# Patient Record
Sex: Male | Born: 1985 | Race: White | Hispanic: Yes | Marital: Married | Smoking: Current some day smoker
Health system: Southern US, Community
[De-identification: ages and names within clinical notes are randomized; demographics above are authoritative.]

## PROBLEM LIST (undated history)

## (undated) DIAGNOSIS — K250 Acute gastric ulcer with hemorrhage: Secondary | ICD-10-CM

## (undated) HISTORY — PX: COLONOSCOPY: SHX174

## (undated) HISTORY — PX: UPPER GI ENDOSCOPY: SHX6162

---

## 2008-12-13 ENCOUNTER — Emergency Department (HOSPITAL_COMMUNITY): Admission: EM | Admit: 2008-12-13 | Discharge: 2008-12-13 | Payer: Self-pay | Admitting: Emergency Medicine

## 2008-12-18 ENCOUNTER — Ambulatory Visit (HOSPITAL_COMMUNITY): Admission: RE | Admit: 2008-12-18 | Discharge: 2008-12-18 | Payer: Self-pay | Admitting: Pain Medicine

## 2010-12-08 ENCOUNTER — Emergency Department (HOSPITAL_COMMUNITY)
Admission: EM | Admit: 2010-12-08 | Discharge: 2010-12-08 | Disposition: A | Discharge status: Home / Self Care | Payer: BC Managed Care – PPO | Attending: Emergency Medicine | Admitting: Emergency Medicine

## 2010-12-08 ENCOUNTER — Emergency Department (HOSPITAL_COMMUNITY): Payer: BC Managed Care – PPO

## 2010-12-08 DIAGNOSIS — N509 Disorder of male genital organs, unspecified: Secondary | ICD-10-CM | POA: Insufficient documentation

## 2010-12-08 LAB — URINALYSIS, ROUTINE W REFLEX MICROSCOPIC
Glucose, UA: NEGATIVE mg/dL
Leukocytes, UA: NEGATIVE
Protein, ur: NEGATIVE mg/dL
pH: 5.5 (ref 5.0–8.0)

## 2011-05-18 DIAGNOSIS — K250 Acute gastric ulcer with hemorrhage: Secondary | ICD-10-CM

## 2011-05-18 HISTORY — DX: Acute gastric ulcer with hemorrhage: K25.0

## 2013-01-25 IMAGING — US US ART/VEN ABD/PELV/SCROTUM DOPPLER LTD
1 series · 14 of 25 positions shown · non-contrast
Comparison: None

CLINICAL DATA: Left scrotal pain

SCROTAL ULTRASOUND
DOPPLER ULTRASOUND OF THE TESTICLES
TECHNIQUE: Complete ultrasound examination of the testicles,
epididymis, and other scrotal structures was performed.  Color and
spectral Doppler ultrasound were also utilized to evaluate blood
flow to the testicles.

[Series 1: us art/ven abd/pelv/scrotum doppler ltd · 0.08mm/px · 14 of 33 slices shown]
[im 1/33]
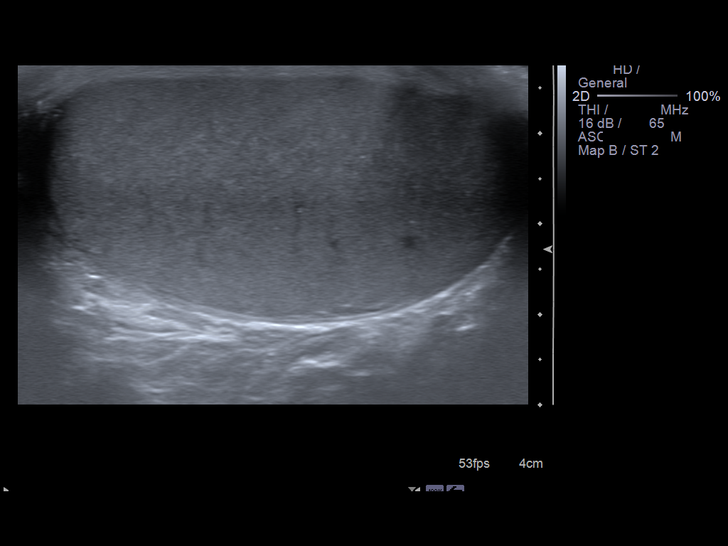
[im 3/33]
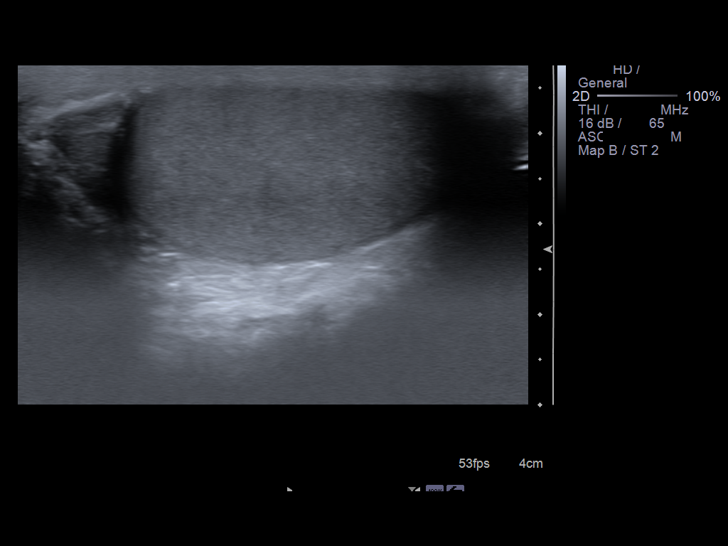
[im 6/33]
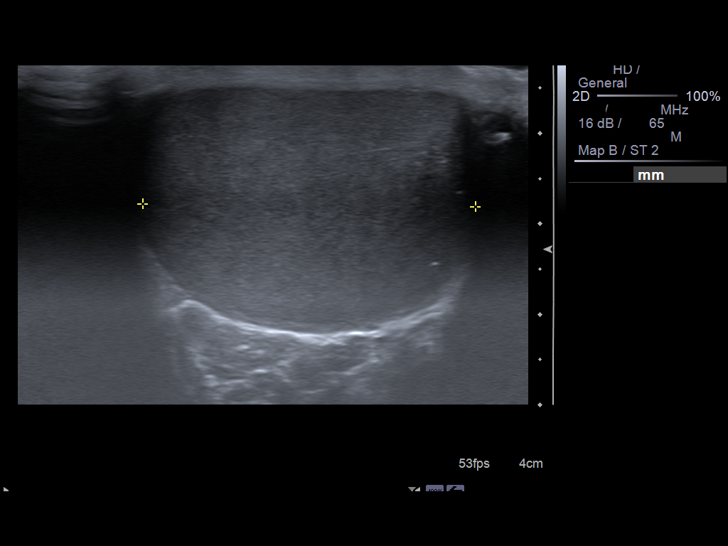
[im 9/33]
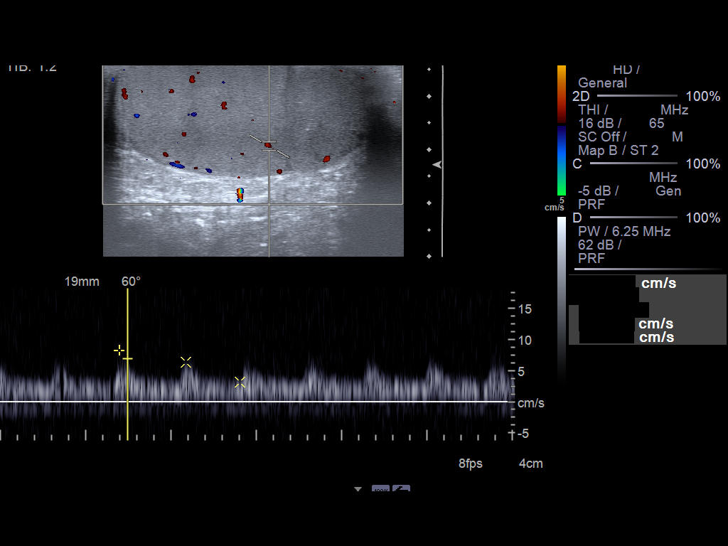
[im 11/33]
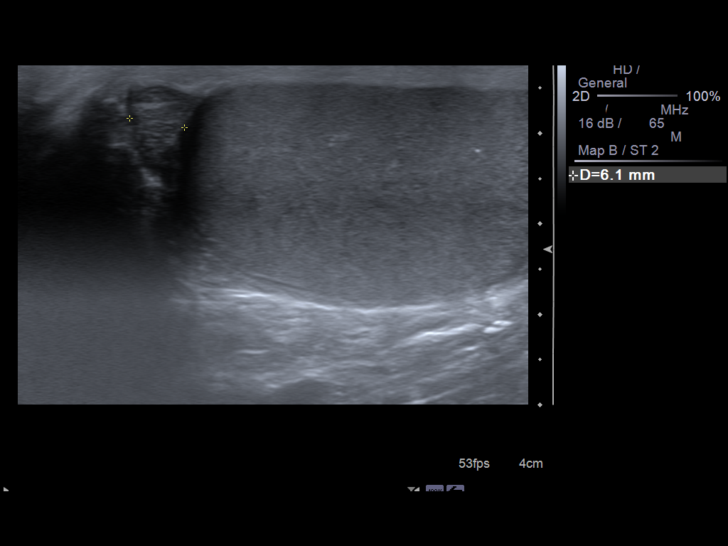
[im 13/33]
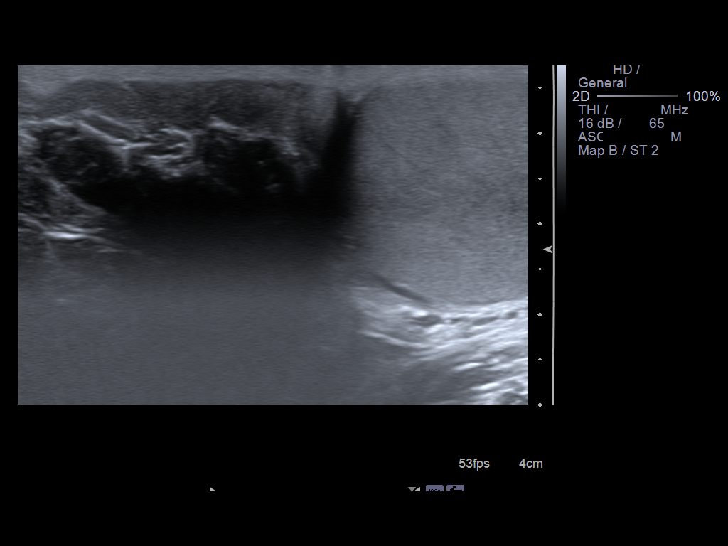
[im 15/33]
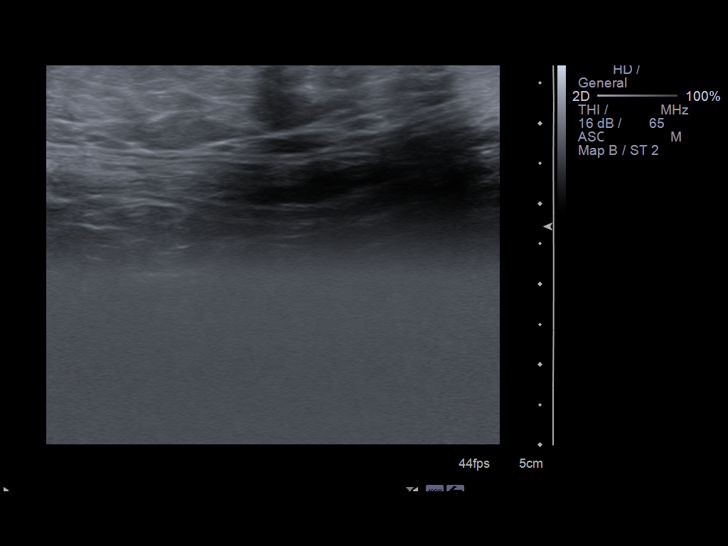
[im 18/33]
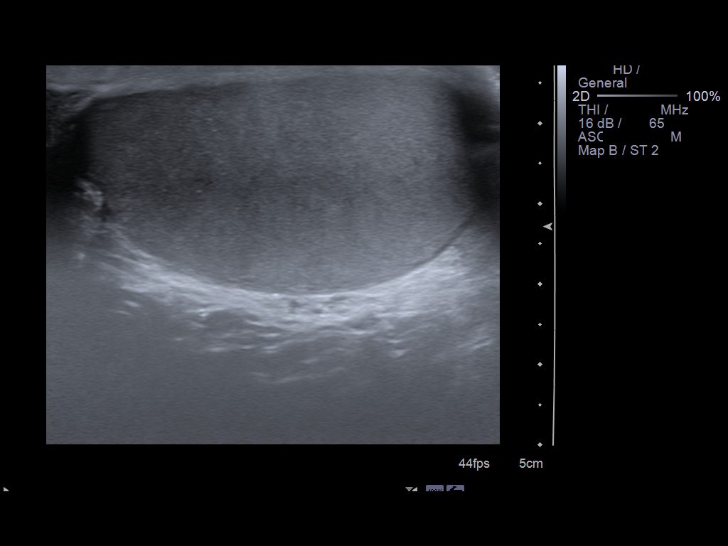
[im 21/33]
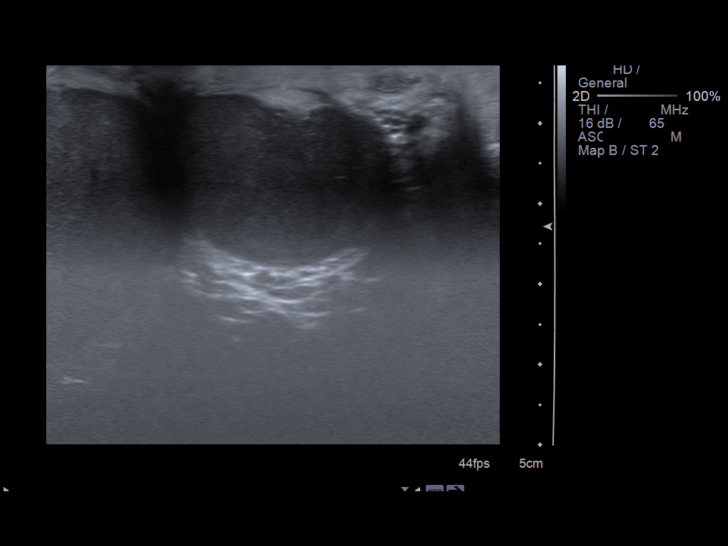
[im 22/33]
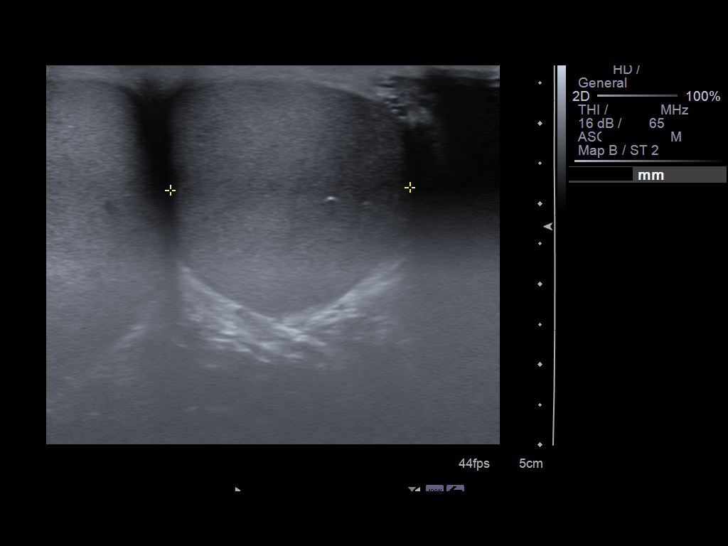
[im 25/33]
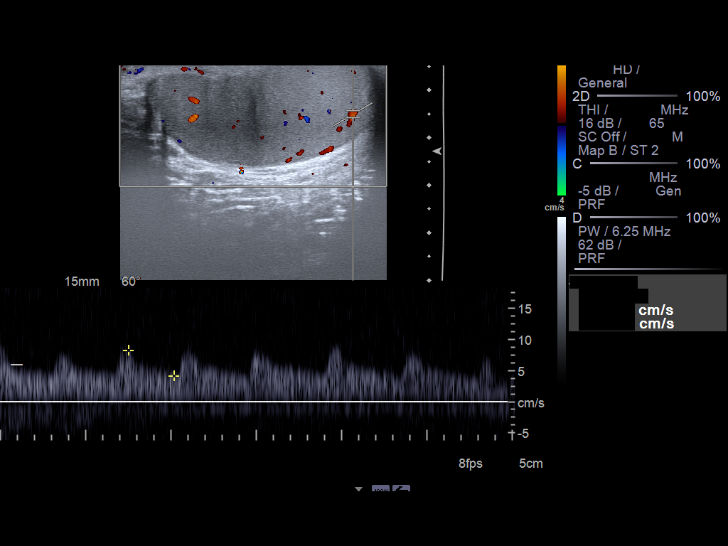
[im 27/33]
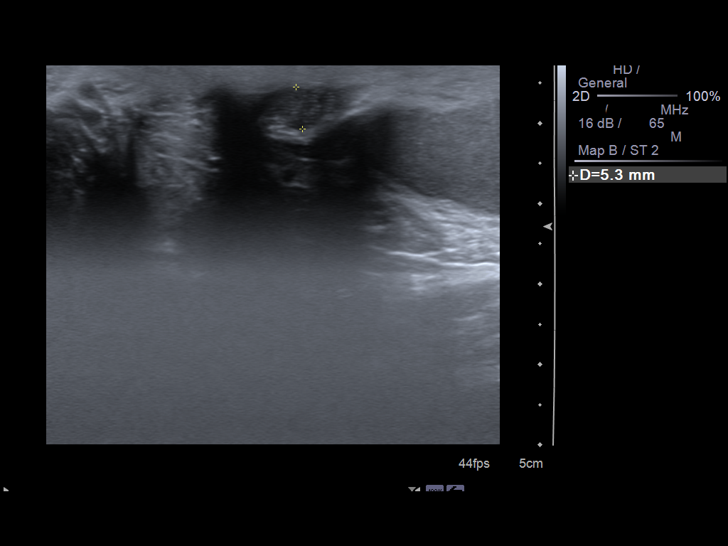
[im 30/33]
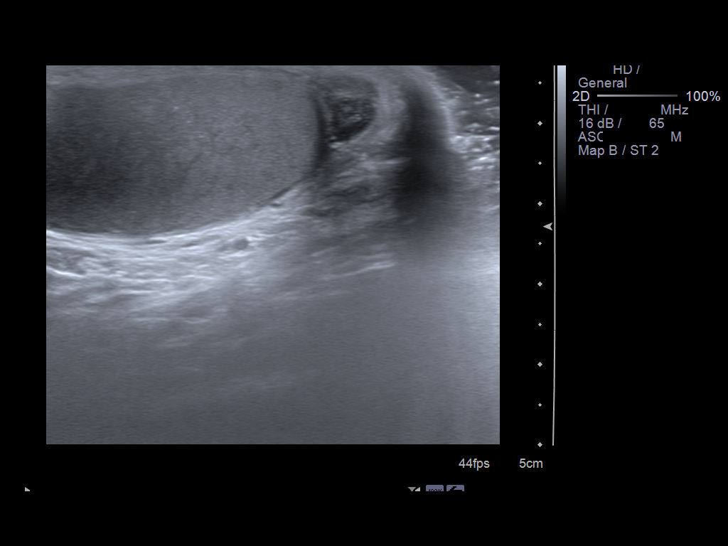
[im 33/33]
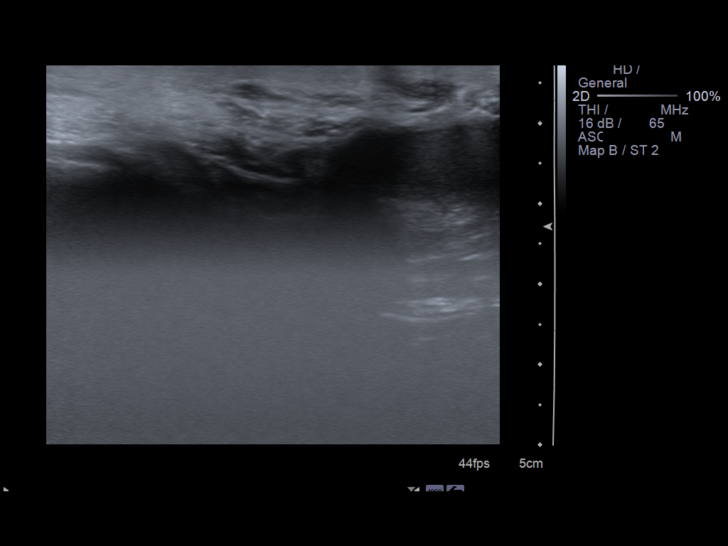

[14 of 25 positions shown; findings below may reference images not displayed]

FINDINGS: Right testis:  27 x 37 x 53 mm with scattered microcalcifications.
Normal color Doppler signal.  Arterial and venous waveforms
returned.

Left testis:  27 x 30 x 50 mm with a few scattered
microcalcifications.  Normal color Doppler signal.  Arterial and
venous waveforms returned.

Right epididymis:  Normal in size and appearance.

Left epididymis:  Normal in size and appearance.

Hydocele:  None

Varicocele:  None seen

Pulsed Doppler interrogation of both testes demonstrates low
resistance flow bilaterally.
IMPRESSION: 1.  Negative for focal mass or evidence of testicular torsion.
2.  Mild bilateral testicular microlithiasis, of questionable
clinical significance.

## 2013-05-05 ENCOUNTER — Emergency Department (HOSPITAL_COMMUNITY)
Admission: EM | Admit: 2013-05-05 | Discharge: 2013-05-06 | Disposition: A | Discharge status: Home / Self Care | Payer: BC Managed Care – PPO | Attending: Emergency Medicine | Admitting: Emergency Medicine

## 2013-05-05 ENCOUNTER — Encounter (HOSPITAL_COMMUNITY): Payer: Self-pay | Admitting: Emergency Medicine

## 2013-05-05 DIAGNOSIS — K921 Melena: Secondary | ICD-10-CM | POA: Insufficient documentation

## 2013-05-05 DIAGNOSIS — Z9889 Other specified postprocedural states: Secondary | ICD-10-CM | POA: Insufficient documentation

## 2013-05-05 DIAGNOSIS — R5381 Other malaise: Secondary | ICD-10-CM | POA: Insufficient documentation

## 2013-05-05 HISTORY — DX: Acute gastric ulcer with hemorrhage: K25.0

## 2013-05-05 LAB — CBC
HCT: 47.1 % (ref 39.0–52.0)
Hemoglobin: 16.7 g/dL (ref 13.0–17.0)
RDW: 13.5 % (ref 11.5–15.5)
WBC: 8.2 10*3/uL (ref 4.0–10.5)

## 2013-05-05 NOTE — ED Notes (Signed)
Pt arrived to ED with a complaint of dark stools.  Pt has a previous diagnosis of Dieulafoy's legions and the symptoms are similar to what he has experienced previously. Pt's child became sick earlier in the week with diarrhea and vomiting which then the Pt began expreriencing.  Pt states that it was yesterday that he began having dark black stools.

## 2013-05-05 NOTE — ED Notes (Signed)
Patient is alert and oriented x3.  He is complaining of mid abdominal pain that he currently rates 2 of 10.  He states that he started having dark tary stools that started yesterday.  He has been Dx with a Dieulafoy lesion in the past.  Currently patient has no hematemesis.

## 2013-05-06 MED ORDER — OMEPRAZOLE 20 MG PO CPDR: 20.0000 mg | DELAYED_RELEASE_CAPSULE | Freq: Every day | ORAL | Status: DC

## 2013-05-06 NOTE — ED Provider Notes (Signed)
Medical screening examination/treatment/procedure(s) were performed by non-physician practitioner and as supervising physician I was immediately available for consultation/collaboration.    Olivia Mackie, MD 05/06/13 2131

## 2013-05-06 NOTE — ED Notes (Signed)
Patient is alert and oriented x3.  He was given DC instructions and follow up visit instructions.  Patient gave verbal understanding.  He was DC ambulatory under his own power to home.  V/S stable.  He was not showing any signs of distress on DC 

## 2013-05-06 NOTE — ED Provider Notes (Signed)
CSN: [id number]     Arrival date & time 05/05/13  2018 History   First MD Initiated Contact with Patient 05/05/13 2057     Chief Complaint  Patient presents with  . Rectal Bleeding   (Consider location/radiation/quality/duration/timing/severity/associated sxs/prior Treatment) The history is provided by the spouse and the patient. The history is limited by a language barrier. Language interpreter used: interpretation provided by wife who is fluent in both languages.  This is a 27 y/o male with a PMH of Dieulafoy's disorder who presents with cc of dark, tarry stools. Patient was diagnosed approx. 2 years ago in [place]. He was admitted and underwent endoscopic cauterization He has had no problems since that thime. The patient states that 2 days ago he began having black stools similar to his previous episode. Denies ingestion of bismuth or Iron supplements. Patient Has associated weakness starting today but denies abdominal pain, nausea, hematemesis, diarrhea, BRPR. Denies use of NSAIDS, etoh. Denies fevers, chills, myalgias, arthralgias. Denies DOE, SOB, chest tightness or pressure, radiation to left arm, jaw or back, or diaphoresis. Denies dysuria, flank pain, suprapubic pain, frequency, urgency, or hematuria. Denies headaches, light headedness, weakness, visual disturbances.    Past Medical History  Diagnosis Date  . Dieulafoy ulcer 2013   No past surgical history on file. No family history on file. History  Substance Use Topics  . Smoking status: Not on file  . Smokeless tobacco: Not on file  . Alcohol Use: Not on file    Review of Systems  Constitutional: Negative for fever and chills.  Respiratory: Negative for cough and shortness of breath.   Cardiovascular: Negative for chest pain and palpitations.  Gastrointestinal: Positive for blood in stool (melena). Negative for vomiting, abdominal pain, diarrhea and constipation.  Genitourinary: Negative for dysuria, urgency and frequency.   Musculoskeletal: Negative for arthralgias and myalgias.  Skin: Negative for rash.  Neurological: Positive for weakness. Negative for headaches.    Allergies  Review of patient's allergies indicates no known allergies.  Home Medications   Current Outpatient Rx  Name  Route  Sig  Dispense  Refill  . loperamide (IMODIUM) 2 MG capsule   Oral   Take by mouth as needed for diarrhea or loose stools.         Marland Kitchen OVER THE COUNTER MEDICATION   Oral   Take 1 tablet by mouth 2 (two) times daily. Patient unaware of name of medication. Picked up at [place] store, used for infection          BP 111/58  Pulse 91  Temp(Src) 99.5 F (37.5 C) (Oral)  Resp 16  SpO2 95% Physical Exam  Nursing note and vitals reviewed. Constitutional: He appears well-developed and well-nourished. No distress.  HENT:  Head: Normocephalic and atraumatic.  Eyes: Conjunctivae are normal. No scleral icterus.  Neck: Normal range of motion. Neck supple.  Cardiovascular: Normal rate, regular rhythm and normal heart sounds.   Pulmonary/Chest: Effort normal and breath sounds normal. No respiratory distress.  Abdominal: Soft. There is no tenderness.  Musculoskeletal: He exhibits no edema.  Neurological: He is alert.  Skin: Skin is warm and dry. He is not diaphoretic.  Psychiatric: His behavior is normal.    ED Course  Procedures (including critical care time) Labs Review Labs Reviewed  CBC   Imaging Review No results found.  EKG Interpretation   None       MDM   1. Melena    BP 111/58  Pulse 91  Temp(Src) 99.5 F (  37.5 C) (Oral)  Resp 16  SpO2 95% Patient with + occult stool blood.  HGB stable. Do not feel he meets inpatient work up at this time. I have discussed specific reasons to return tot he ED. Patient is to follow closely with GI.  The patient appears reasonably screened and/or stabilized for discharge and I doubt any other medical condition or other [facility] requiring further screening,  evaluation, or treatment in the ED at this time prior to discharge.     Arthor Captain, PA-C 05/06/13 1539

## 2013-12-23 ENCOUNTER — Inpatient Hospital Stay (HOSPITAL_COMMUNITY)
Admission: EM | Admit: 2013-12-23 | Discharge: 2013-12-25 | DRG: 379 | Disposition: A | Discharge status: Home / Self Care | Payer: BC Managed Care – PPO | Attending: Family Medicine | Admitting: Family Medicine

## 2013-12-23 ENCOUNTER — Encounter (HOSPITAL_COMMUNITY): Payer: Self-pay | Admitting: Emergency Medicine

## 2013-12-23 ENCOUNTER — Encounter (HOSPITAL_COMMUNITY)
Admission: EM | Disposition: A | Discharge status: Home / Self Care | Payer: Self-pay | Source: Home / Self Care | Attending: Family Medicine

## 2013-12-23 DIAGNOSIS — Z23 Encounter for immunization: Secondary | ICD-10-CM

## 2013-12-23 DIAGNOSIS — K260 Acute duodenal ulcer with hemorrhage: Principal | ICD-10-CM | POA: Diagnosis present

## 2013-12-23 DIAGNOSIS — K921 Melena: Secondary | ICD-10-CM

## 2013-12-23 DIAGNOSIS — R12 Heartburn: Secondary | ICD-10-CM | POA: Diagnosis present

## 2013-12-23 DIAGNOSIS — K264 Chronic or unspecified duodenal ulcer with hemorrhage: Secondary | ICD-10-CM | POA: Diagnosis present

## 2013-12-23 DIAGNOSIS — A048 Other specified bacterial intestinal infections: Secondary | ICD-10-CM | POA: Diagnosis present

## 2013-12-23 DIAGNOSIS — K922 Gastrointestinal hemorrhage, unspecified: Secondary | ICD-10-CM | POA: Diagnosis present

## 2013-12-23 DIAGNOSIS — F17200 Nicotine dependence, unspecified, uncomplicated: Secondary | ICD-10-CM | POA: Diagnosis present

## 2013-12-23 HISTORY — PX: ESOPHAGOGASTRODUODENOSCOPY: SHX5428

## 2013-12-23 LAB — CBC WITH DIFFERENTIAL/PLATELET
BASOS PCT: 0 % (ref 0–1)
Basophils Absolute: 0 10*3/uL (ref 0.0–0.1)
EOS ABS: 0.2 10*3/uL (ref 0.0–0.7)
EOS PCT: 2 % (ref 0–5)
HCT: 47.7 % (ref 39.0–52.0)
HEMOGLOBIN: 16.8 g/dL (ref 13.0–17.0)
LYMPHS ABS: 2 10*3/uL (ref 0.7–4.0)
Lymphocytes Relative: 26 % (ref 12–46)
MCH: 29.9 pg (ref 26.0–34.0)
MCHC: 35.2 g/dL (ref 30.0–36.0)
MCV: 85 fL (ref 78.0–100.0)
MONOS PCT: 5 % (ref 3–12)
Monocytes Absolute: 0.4 10*3/uL (ref 0.1–1.0)
Neutro Abs: 5.1 10*3/uL (ref 1.7–7.7)
Neutrophils Relative %: 67 % (ref 43–77)
PLATELETS: 226 10*3/uL (ref 150–400)
RBC: 5.61 MIL/uL (ref 4.22–5.81)
RDW: 13 % (ref 11.5–15.5)
WBC: 7.7 10*3/uL (ref 4.0–10.5)

## 2013-12-23 LAB — COMPREHENSIVE METABOLIC PANEL
ALBUMIN: 4.4 g/dL (ref 3.5–5.2)
ALT: 150 U/L — ABNORMAL HIGH (ref 0–53)
ANION GAP: 14 (ref 5–15)
AST: 60 U/L — ABNORMAL HIGH (ref 0–37)
Alkaline Phosphatase: 60 U/L (ref 39–117)
BUN: 23 mg/dL (ref 6–23)
CALCIUM: 9.5 mg/dL (ref 8.4–10.5)
CO2: 21 mEq/L (ref 19–32)
Chloride: 101 mEq/L (ref 96–112)
Creatinine, Ser: 0.77 mg/dL (ref 0.50–1.35)
GFR calc non Af Amer: >90 mL/min (ref >90)
GLUCOSE: 106 mg/dL — AB (ref 70–99)
Potassium: 4.4 mEq/L (ref 3.7–5.3)
Sodium: 136 mEq/L — ABNORMAL LOW (ref 137–147)
TOTAL PROTEIN: 8 g/dL (ref 6.0–8.3)
Total Bilirubin: 0.6 mg/dL (ref 0.3–1.2)

## 2013-12-23 LAB — PROTIME-INR
INR: 0.99 (ref 0.00–1.49)
PROTHROMBIN TIME: 13.1 s (ref 11.6–15.2)

## 2013-12-23 LAB — TYPE AND SCREEN
ABO/RH(D): O POS
ANTIBODY SCREEN: NEGATIVE

## 2013-12-23 LAB — ABO/RH: ABO/RH(D): O POS

## 2013-12-23 LAB — HEMOGLOBIN AND HEMATOCRIT, BLOOD
HEMATOCRIT: 42 % (ref 39.0–52.0)
HEMOGLOBIN: 14.7 g/dL (ref 13.0–17.0)

## 2013-12-23 LAB — APTT: APTT: 31 s (ref 24–37)

## 2013-12-23 LAB — POC OCCULT BLOOD, ED: Fecal Occult Bld: POSITIVE — AB

## 2013-12-23 SURGERY — EGD (ESOPHAGOGASTRODUODENOSCOPY)
Anesthesia: Moderate Sedation

## 2013-12-23 MED ORDER — SODIUM CHLORIDE 0.9 % IV SOLN
INTRAVENOUS | Status: AC
  Administered 2013-12-23: 20:00:00 via INTRAVENOUS

## 2013-12-23 MED ORDER — MIDAZOLAM HCL 10 MG/2ML IJ SOLN
INTRAMUSCULAR | Status: DC | PRN
  Administered 2013-12-23: 1 mg via INTRAVENOUS
  Administered 2013-12-23 (×3): 2 mg via INTRAVENOUS

## 2013-12-23 MED ORDER — MIDAZOLAM HCL 10 MG/2ML IJ SOLN
INTRAMUSCULAR | Status: AC
  Filled 2013-12-23: qty 4

## 2013-12-23 MED ORDER — EPINEPHRINE HCL 0.1 MG/ML IJ SOSY
PREFILLED_SYRINGE | INTRAMUSCULAR | Status: AC
  Filled 2013-12-23: qty 10

## 2013-12-23 MED ORDER — DIPHENHYDRAMINE HCL 50 MG/ML IJ SOLN
INTRAMUSCULAR | Status: AC
  Filled 2013-12-23: qty 1

## 2013-12-23 MED ORDER — DIPHENHYDRAMINE HCL 50 MG/ML IJ SOLN
INTRAMUSCULAR | Status: DC | PRN
  Administered 2013-12-23: 25 mg via INTRAVENOUS

## 2013-12-23 MED ORDER — BUTAMBEN-TETRACAINE-BENZOCAINE 2-2-14 % EX AERO
INHALATION_SPRAY | CUTANEOUS | Status: DC | PRN
  Administered 2013-12-23: 2 via TOPICAL

## 2013-12-23 MED ORDER — ONDANSETRON HCL 4 MG/2ML IJ SOLN: 4.0000 mg | Freq: Four times a day (QID) | INTRAMUSCULAR | Status: DC | PRN

## 2013-12-23 MED ORDER — SODIUM CHLORIDE 0.9 % IV BOLUS (SEPSIS)
1000.0000 mL | Freq: Once | INTRAVENOUS | Status: AC
  Administered 2013-12-23: 1000 mL via INTRAVENOUS

## 2013-12-23 MED ORDER — FENTANYL CITRATE 0.05 MG/ML IJ SOLN
INTRAMUSCULAR | Status: DC | PRN
  Administered 2013-12-23 (×4): 25 ug via INTRAVENOUS

## 2013-12-23 MED ORDER — PANTOPRAZOLE SODIUM 40 MG IV SOLR
80.0000 mg | Freq: Once | INTRAVENOUS | Status: AC
  Administered 2013-12-23: 80 mg via INTRAVENOUS
  Filled 2013-12-23: qty 80

## 2013-12-23 MED ORDER — ONDANSETRON HCL 4 MG PO TABS: 4.0000 mg | ORAL_TABLET | Freq: Four times a day (QID) | ORAL | Status: DC | PRN

## 2013-12-23 MED ORDER — PNEUMOCOCCAL VAC POLYVALENT 25 MCG/0.5ML IJ INJ
0.5000 mL | INJECTION | INTRAMUSCULAR | Status: DC
  Filled 2013-12-23 (×3): qty 0.5

## 2013-12-23 MED ORDER — PANTOPRAZOLE SODIUM 40 MG IV SOLR: 40.0000 mg | Freq: Two times a day (BID) | INTRAVENOUS | Status: DC

## 2013-12-23 MED ORDER — SODIUM CHLORIDE 0.9 % IV SOLN
INTRAVENOUS | Status: DC
  Administered 2013-12-24: 02:00:00 via INTRAVENOUS

## 2013-12-23 MED ORDER — FENTANYL CITRATE 0.05 MG/ML IJ SOLN
INTRAMUSCULAR | Status: AC
  Filled 2013-12-23: qty 4

## 2013-12-23 MED ORDER — SODIUM CHLORIDE 0.9 % IV SOLN
8.0000 mg/h | INTRAVENOUS | Status: DC
  Administered 2013-12-23 – 2013-12-25 (×5): 8 mg/h via INTRAVENOUS
  Filled 2013-12-23 (×10): qty 80

## 2013-12-23 NOTE — Progress Notes (Signed)
Spoke to the patient's wife, Billy CoastDaynez, by phone.  We discussed EGD findings of bleeding ulceration with plans for admission. She thanked me for the call

## 2013-12-23 NOTE — H&P (Signed)
[facility] History and Physical  Chad MassyRicardo Marotto ZOX:[id number] DOB: 01/17/1986 DOA: 12/23/2013  Referring physician: Rhea BeltonPyrtle PCP: No primary provider on file.   Chief Complaint: black stool  HPI: Chad MassyRicardo Kaiser is a 28 y.o. male  Speaks little English, presents with 2 episodes melena today. No N/V. No pain. Previous h/o GIB requiring transfusion. Had EGD by Dr. Rhea BeltonPyrtle, shows large blood clot in stomach and bleeding duodenal ulcer, which was injected then clipped.  Started on clear liquids, protonix gtt and referred to medicine for admission. No NSAIDs. [facility] per wife who provides most history.   Review of Systems:  Systems reviewed. As above, otherwise negative  Past Medical History  Diagnosis Date  . Dieulafoy ulcer 2013   Past Surgical History  Procedure Laterality Date  . Upper gi endoscopy    . Colonoscopy     Social History: drinks occasionally. Smokes cigarettes occasionally. Denies drugs. Works in Education administrator"demolition"  No Known Allergies  History reviewed. No pertinent family history.   Prior to Admission medications   Not on File   Physical Exam: Filed Vitals:   12/23/13 1535 12/23/13 1608 12/23/13 1630 12/23/13 1748  BP:  123/76 136/84 121/72  Pulse: 113 86 95 89  Temp:  98.7 F (37.1 C)  97.7 F (36.5 C)  TempSrc:  Oral  Oral  Resp: 16 18 17 16   SpO2: 100% 94% 99% 97%    Wt Readings from Last 3 Encounters:  No data found for Wt  BP 121/72  Pulse 89  Temp(Src) 97.7 F (36.5 C) (Oral)  Resp 16  SpO2 97%  General Appearance:    Groggy. arousable and cooperative  Head:    Normocephalic, without obvious abnormality, atraumatic  Eyes:    PERRL, pale conjunctiva          Nose:   Nares normal, septum midline, mucosa normal, no drainage   or sinus tenderness  Throat:   Lips, mucosa, and tongue normal; teeth and gums normal  Neck:   Supple, symmetrical, trachea midline, no adenopathy;       thyroid:  No  enlargement/tenderness/nodules; no carotid   bruit or JVD  Back:     Symmetric, no curvature, ROM normal, no CVA tenderness  Lungs:     Clear to auscultation bilaterally, respirations unlabored  Chest wall:    No tenderness or deformity  Heart:    Regular rate and rhythm, S1 and S2 normal, no murmur, rub   or gallop  Abdomen:     Soft, non-tender, bowel sounds active all four quadrants,    no masses, no organomegaly  Genitalia:    deferred  Rectal:   deferred  Extremities:   Extremities normal, atraumatic, no cyanosis or edema  Pulses:   2+ and symmetric all extremities  Skin:   Skin color, texture, turgor normal, no rashes or lesions  Lymph nodes:   Cervical, supraclavicular, and axillary nodes normal  Neurologic:   CNII-XII intact. Normal strength, sensation and reflexes      throughout           Psych: normal affect  Labs on Admission:  Basic Metabolic Panel:  Recent Labs Lab 12/23/13 1143  NA 136*  K 4.4  CL 101  CO2 21  GLUCOSE 106*  BUN 23  CREATININE 0.77  CALCIUM 9.5   Liver Function Tests:  Recent Labs Lab 12/23/13 1143  AST 60*  ALT 150*  ALKPHOS 60  BILITOT 0.6  PROT 8.0  ALBUMIN 4.4  No results found for this basename: LIPASE, AMYLASE,  in the last 168 hours No results found for this basename: AMMONIA,  in the last 168 hours CBC:  Recent Labs Lab 12/23/13 1143  WBC 7.7  NEUTROABS 5.1  HGB 16.8  HCT 47.7  MCV 85.0  PLT 226   Cardiac Enzymes: No results found for this basename: CKTOTAL, CKMB, CKMBINDEX, TROPONINI,  in the last 168 hours  BNP (last 3 results) No results found for this basename: PROBNP,  in the last 8760 hours CBG: No results found for this basename: GLUCAP,  in the last 168 hours  Radiological Exams on Admission: No results found.  Assessment/Plan Active Problems:   Duodenal ulcer with hemorrhage   GI bleed   Admit to floor. Clears. protonix gtt. Serial h/h. F/u h pylori serology   Code Status: full DVT  Prophylaxis: SCD Family Communication: wife at bedside Disposition Plan: home  Time spent: 60 min  Charina Fons L [facility] Pager [phone]

## 2013-12-23 NOTE — ED Provider Notes (Signed)
  Physical Exam  BP 123/76  Pulse 86  Temp(Src) 98.7 F (37.1 C) (Oral)  Resp 18  SpO2 94%  Physical Exam  ED Course  Procedures Dr. Rhea BeltonPyrtle scoped patient. Found active bleeding ulcer. Recommend admission. Patient on protonix drip. Hg stable. Vitals stable. Will admit to floor.       Richardean Canalavid H Johnette Teigen, MD 12/23/13 [id number]

## 2013-12-23 NOTE — ED Provider Notes (Signed)
TIME SEEN: 11:20 AM  CHIEF COMPLAINT: Melena  HPI: Patient is a 28 year old male with a history of prior ulcer who presents to the emergency department with melena that started today. Patient's wife reports approximately one year ago he had a GI bleed in [place] and had an endoscopy and colonoscopy.  She reports that he had a bleeding area in his colon that was surgically repaired.  He states he has had intermittent melena over the past year, last was in December. He states that he is now at 3 episodes of melena today which made him concerned. He denies any bright red blood per time. Denies any abdominal pain, fevers, chest pain or shortness of breath, dizziness. He is not on anticoagulation. Denies a history of heavy alcohol use or NSAID use. He does have a primary care physician or gastroenterologist here in [place].   ROS: See HPI Constitutional: no fever  Eyes: no drainage  ENT: no runny nose   Cardiovascular:  no chest pain  Resp: no SOB  GI: no vomiting GU: no dysuria Integumentary: no rash  Allergy: no hives  Musculoskeletal: no leg swelling  Neurological: no slurred speech ROS otherwise negative  PAST MEDICAL HISTORY/PAST SURGICAL HISTORY:  Past Medical History  Diagnosis Date  . Dieulafoy ulcer 2013    MEDICATIONS:  Prior to Admission medications   Not on File    ALLERGIES:  No Known Allergies  SOCIAL HISTORY:  History  Substance Use Topics  . Smoking status: Never Smoker   . Smokeless tobacco: Not on file  . Alcohol Use: No    FAMILY HISTORY: No family history on file.  EXAM: BP 140/107  Pulse 118  Temp(Src) 98.7 F (37.1 C) (Oral)  Resp 16  SpO2 98% CONSTITUTIONAL: Alert and oriented and responds appropriately to questions. Well-appearing; well-nourished HEAD: Normocephalic EYES: Conjunctivae clear, PERRL ENT: normal nose; no rhinorrhea; moist mucous membranes; pharynx without lesions noted NECK: Supple, no meningismus, no LAD  CARD:  regular  and tachycardic ; S1 and S2 appreciated; no murmurs, no clicks, no rubs, no gallops RESP: Normal chest excursion without splinting or tachypnea; breath sounds clear and equal bilaterally; no wheezes, no rhonchi, no rales,  ABD/GI: Normal bowel sounds; non-distended; soft, non-tender, no rebound, no guarding RECTAL:  Patient has normal rectal tone, patient has melanotic stools, no gross blood  BACK:  The back appears normal and is non-tender to palpation, there is no CVA tenderness EXT: Normal ROM in all joints; non-tender to palpation; no edema; normal capillary refill; no cyanosis    SKIN: Normal color for age and race; warm NEURO: Moves all extremities equally PSYCH: The patient's mood and manner are appropriate. Grooming and personal hygiene are appropriate.  MEDICAL DECISION MAKING: Patient here with melena. He is tachycardic but otherwise hemodynamically stable. He has no abdominal pain on exam. Will check CBC, type and screen. We'll discuss with GI on call. We'll give IV fluids, protonix bolus and drip.  ED PROGRESS:   Pt's Hb is 16 which is stable.  D/w Dr. Rhea Kaiser with GI who will try to take pt to endoscopy now.  NPO since 10pm other than small amount of liquids this AM.  Pt agrees with plan.   2:21 PM  HR has improved with IV fluids. He has been taken to endoscopy. Disposition per GI.  CRITICAL CARE Performed by: Chad Kaiser   Total critical care time: 45 minutes  Critical care time was exclusive of separately billable procedures and treating other  patients.  GI bleed, on protonix drip, gastroenterology consult.   Critical care was necessary to treat or prevent imminent or life-threatening deterioration.  Critical care was time spent personally by me on the following activities: development of treatment plan with patient and/or surrogate as well as nursing, discussions with consultants, evaluation of patient's response to treatment, examination of patient, obtaining history  from patient or surrogate, ordering and performing treatments and interventions, ordering and review of laboratory studies, ordering and review of radiographic studies, pulse oximetry and re-evaluation of patient's condition.     Chad MawKristen N Ward, DO 12/23/13 1422

## 2013-12-23 NOTE — ED Notes (Signed)
Bed: WA09 Expected date:  Expected time:  Means of arrival:  Comments: Pt from endoscopy

## 2013-12-23 NOTE — Op Note (Signed)
[facility] [address] [place], [id number]   ENDOSCOPY PROCEDURE REPORT  PATIENT: Chad Kaiser, Chad Kaiser  MR#: [id number] BIRTHDATE: 19-Sep-1985 , 28  yrs. old GENDER: Male ENDOSCOPIST: Beverley FiedlerJay M Annalyse Langlais, MD REFERRED BY:  Dr. Baxter HireKristen Ward, DO PROCEDURE DATE:  12/23/2013 PROCEDURE:  EGD w/ control of bleeding ASA CLASS:     Class II INDICATIONS:  Melena. MEDICATIONS: These medications were titrated to patient response per physician's verbal order, Fentanyl 100 mcg IV, Versed 8 mg IV, Diphenhydramine (Benadryl) 25 mg IV, and Epinephrine 3 cc TOPICAL ANESTHETIC: Cetacaine Spray  DESCRIPTION OF PROCEDURE: After the risks benefits and alternatives of the procedure were thoroughly explained, informed consent was obtained.  The Pentax Gastroscope [id number] endoscope was introduced through the mouth and advanced to the second portion of the duodenum. Without limitations.  The instrument was slowly withdrawn as the mucosa was fully examined.  ESOPHAGUS: The mucosa of the esophagus appeared normal.   A variable Z-line was observed 40 cm from the incisors.  STOMACH: Fresh blood and clot was found in the stomach.  Copious irrigation and lavage performed, but not all blood could be cleared from the stomach, therefore views of some portions of the gastric cardia, fundus, and body were incomplete  DUODENUM: A single ulcer measuring 4 x 6mm in size with an adherent clot and active bleeding was found in the duodenal bulb. Initially epinephrine injection was performed around the ulcer using epinephrine 1:10,000 (3 cc total).  This resulted in definite slowing of bleeding and blanching of the mucosa.  Complete hemostasis was achieved by placing two [facility] hemoclips on the bleeding site.  No other ulcerations were seen in the second portion of the duodenum was unremarkable.  Retroflexed views revealed no abnormalities.     The scope was then withdrawn from the patient and the  procedure completed. COMPLICATIONS: There were no complications.  ENDOSCOPIC IMPRESSION:. 1.   The mucosa of the esophagus appeared normal; variable Z-line was observed 40 cm from the incisors 3.   Blood and clot was found in the stomach obscuring complete visualization, but no gastric bleeding lesions seen 4.   Actively bleeding ulcer measuring 4 x 6mm in size was found in the duodenal bulb; Complete hemostasis was achieved by epinephrine injection and by placing two hemoclips on the bleeding site  RECOMMENDATIONS: 1.  Admit to the hospital for ongoing PPI infusion status post treatment of actively bleeding duodenal ulcer 2.  Monitor Hgb 3.  H pylori Ab, treat if positive 4.  No NSAIDs  eSigned:  Beverley FiedlerJay M Gudrun Axe, MD 12/23/2013 3:30 PM CC:The Patient  PATIENT NAME:  Chad Kaiser, Chad Kaiser MR#: [id number]

## 2013-12-23 NOTE — ED Notes (Signed)
He states he saw "a lot of red blood in my stool" this morning.  He further tells us that when he lived in [place], he underwent both upper and lower endoscopy; "and they clipped a bleeding area in my colon".  He is in no distress and denies any pain.

## 2013-12-23 NOTE — Consult Note (Signed)
Consult Note  Primary Care Physician:  No primary provider on file. Primary Gastroenterologist:  None Reason for Consult: melena Referring MD: Dr. Rochele RaringKristen Ward, DO (ED)  HPI: Chad Kaiser is a 28 y.o. male Latino male with past medical history of GI bleed approximately 18 months ago when [place] living [place] who presents today with melena. He reports he was in his usual state of health until this morning when he developed loose black and tarry stools. Yesterday he was feeling well with normal bowel movement. Today he denies pain. No nausea or vomiting. No recent weight loss, fevers or chills. He does report intermittent heartburn but he is not taking any over-the-counter antacids or PPI  He reports 18 months ago he had a "rare bleeding lesion" and he uses the word "Dieulafoy".  He had upper endoscopy and colonoscopy, but he cannot remember whether the bleeding lesion was in his stomach or found at the time of his colonoscopy.    He denies a family history of GI malignancy or IBD.   Past Medical History  Diagnosis Date  . Dieulafoy ulcer 2013    Past Surgical History  Procedure Laterality Date  . Upper gi endoscopy    . Colonoscopy      Prior to Admission medications   Not on File    Current Facility-Administered Medications  Medication Dose Route Frequency Provider Last Rate Last Dose  . 0.9 %  sodium chloride infusion   Intravenous Continuous Beverley FiedlerJay M Pyrtle, MD      . butamben-tetracaine-benzocaine (CETACAINE) spray    PRN Beverley FiedlerJay M Pyrtle, MD   2 spray at 12/23/13 1450  . pantoprazole (PROTONIX) 80 mg in sodium chloride 0.9 % 250 mL (0.32 mg/mL) infusion  8 mg/hr Intravenous Continuous Kristen N Ward, DO 25 mL/hr at 12/23/13 1306 8 mg/hr at 12/23/13 1306  . [START ON 12/26/2013] pantoprazole (PROTONIX) injection 40 mg  40 mg Intravenous Q12H Kristen N Ward, DO        Allergies as of 12/23/2013  . (No Known Allergies)    History reviewed. No pertinent family  history.  History   Social History  . Marital Status: Single    Spouse Name: N/A    Number of Children: N/A  . Years of Education: N/A   Occupational History  . Not on file.   Social History Main Topics  . Smoking status: Never Smoker   . Smokeless tobacco: Not on file  . Alcohol Use: No  . Drug Use: Not on file  . Sexual Activity: Not on file   Other Topics Concern  . Not on file   Social History Narrative  . No narrative on file    Review of Systems:  All systems reviewed an negative except where noted in HPI.   Physical Exam: Vital signs in last 24 hours: Temp:  [98.4 F (36.9 C)-98.8 F (37.1 C)] 98.8 F (37.1 C) (08/09 1427) Pulse Rate:  [96-118] 99 (08/09 1427) Resp:  [16-18] 18 (08/09 1427) BP: (113-146)/(79-107) 146/80 mmHg (08/09 1427) SpO2:  [94 %-98 %] 94 % (08/09 1427)   Gen: awake, alert, NAD HEENT: anicteric, op clear CV: RRR, no mrg Pulm: CTA b/l Abd: soft, obese, NT/ND, +BS throughout Ext: no c/c/e Neuro: nonfocal  Lab Results:  Recent Labs  12/23/13 1143  WBC 7.7  HGB 16.8  HCT 47.7  PLT 226   BMET  Recent Labs  12/23/13 1143  NA 136*  K 4.4  CL 101  CO2  21  GLUCOSE 106*  BUN 23  CREATININE 0.77  CALCIUM 9.5   LFT  Recent Labs  12/23/13 1143  PROT 8.0  ALBUMIN 4.4  AST 60*  ALT 150*  ALKPHOS 60  BILITOT 0.6   PT/INR  Recent Labs  12/23/13 1143  LABPROT 13.1  INR 0.99    Impression / Plan:   28 y.o. male Latino male with past medical history of GI bleed approximately 18 months ago when he was living in [place] who presents today with melena. He reports he was in his usual state of health until this morning when he developed loose black and tarry stools.   1.  Melena/hx of Dieulafoy lesion -- given the melena that developed this morning, I have recommended upper endoscopy. Currently he is stable and blood count is normal. Bleeding has started today.  He has been placed on PPI gtt by ED.  The nature of the  procedure, as well as the risks, benefits, and alternatives were carefully and thoroughly reviewed with the patient. Ample time for discussion and questions allowed. The patient understood, was satisfied, and agreed to proceed. Depending on the source of bleeding and the risk of rebleeding, the patient may need to be admitted to the hospital.     LOS: 0 days   PYRTLE, JAY M  12/23/2013, 2:50 PM

## 2013-12-24 ENCOUNTER — Encounter (HOSPITAL_COMMUNITY): Payer: Self-pay | Admitting: Internal Medicine

## 2013-12-24 LAB — CBC
HEMATOCRIT: 40.8 % (ref 39.0–52.0)
Hemoglobin: 13.8 g/dL (ref 13.0–17.0)
MCH: 29.2 pg (ref 26.0–34.0)
MCHC: 33.8 g/dL (ref 30.0–36.0)
MCV: 86.3 fL (ref 78.0–100.0)
Platelets: 199 10*3/uL (ref 150–400)
RBC: 4.73 MIL/uL (ref 4.22–5.81)
RDW: 13.2 % (ref 11.5–15.5)
WBC: 7.9 10*3/uL (ref 4.0–10.5)

## 2013-12-24 LAB — HELICOBACTER PYLORI ABS-IGG+IGA, BLD
H PYLORI IGA: 27.2 U/mL — AB (ref <9.0)
H Pylori IgG: >8 {ISR} — ABNORMAL HIGH

## 2013-12-24 LAB — HEPATITIS PANEL, ACUTE
HCV AB: NEGATIVE
Hep A IgM: NONREACTIVE
Hep B C IgM: NONREACTIVE
Hepatitis B Surface Ag: NEGATIVE

## 2013-12-24 MED ORDER — CLARITHROMYCIN 500 MG PO TABS
500.0000 mg | ORAL_TABLET | Freq: Two times a day (BID) | ORAL | Status: DC
  Administered 2013-12-24 – 2013-12-25 (×3): 500 mg via ORAL
  Filled 2013-12-24 (×4): qty 1

## 2013-12-24 MED ORDER — AMOXICILLIN 500 MG PO CAPS
500.0000 mg | ORAL_CAPSULE | Freq: Four times a day (QID) | ORAL | Status: DC
  Administered 2013-12-24 – 2013-12-25 (×4): 500 mg via ORAL
  Filled 2013-12-24 (×7): qty 1

## 2013-12-24 NOTE — Progress Notes (Signed)
Patient ID: Chad MassyRicardo Kaiser, male   DOB: 09/15/1985, 28 y.o.   MRN: [id number]  [facility] Progress Note  Subjective: Hungry, no BM's, no c/o pain, no nausea or vomiting. Patients wife interprets- he did not know what was found at EGD- explained findings  Objective:  Vital signs in last 24 hours: Temp:  [97.6 F (36.4 C)-98.8 F (37.1 C)] 97.8 F (36.6 C) (08/10 1000) Pulse Rate:  [68-113] 84 (08/10 1000) Resp:  [11-22] 18 (08/10 1000) BP: (100-165)/(55-112) 128/75 mmHg (08/10 1000) SpO2:  [94 %-100 %] 98 % (08/10 1000) Weight:  [250 lb (113.399 kg)] 250 lb (113.399 kg) (08/10 0028) Last BM Date: 12/23/13 General:   Alert,  Well-developed, Hispanic male   in NAD Heart:  Regular rate and rhythm; no murmurs Pulm;clear Abdomen:  Soft, nontender and nondistended. Normal bowel sounds, without guarding, and without rebound.   Extremities:  Without edema. Neurologic:  Alert and  oriented x4;  grossly normal neurologically. Psych:  Alert and cooperative. Normal mood and affect.  Intake/Output from previous day: 08/09 0701 - 08/10 0700 In: 1071.8 [I.V.:1071.8] Out: 400 [Urine:400] Intake/Output this shift: Total I/O In: -  Out: 1000 [Urine:1000]  Lab Results:  Recent Labs  12/23/13 1143 12/23/13 1800 12/24/13 0505  WBC 7.7  --  7.9  HGB 16.8 14.7 13.8  HCT 47.7 42.0 40.8  PLT 226  --  199   BMET  Recent Labs  12/23/13 1143  NA 136*  K 4.4  CL 101  CO2 21  GLUCOSE 106*  BUN 23  CREATININE 0.77  CALCIUM 9.5   LFT  Recent Labs  12/23/13 1143  PROT 8.0  ALBUMIN 4.4  AST 60*  ALT 150*  ALKPHOS 60  BILITOT 0.6   PT/INR  Recent Labs  12/23/13 1143  LABPROT 13.1  INR 0.99    Assessment / Plan: #1  28 yo hispanic male  With acute UGI bleed secondary  To a duodenal bulb ulcer with visible vessel. He required injection of epi and endoclipping. Stable since procedure- no active bleeding He should stay on PPI infusion for 48 hours, then home  on BID PPI x one month then QD x one month Await hpylori AB- treat if + He denies ASA/ Nsaids  #2  Elevated LFTs- check hep B and C serologies    Active Problems:   Duodenal ulcer with hemorrhage   GI bleed   Acute duodenal ulcer with bleeding     LOS: 1 day   Amy Esterwood  12/24/2013, 10:57 AM  Attending MD note:   I have taken a history, examined the patient, and reviewed the chart. I agree with the Advanced Practitioner's impression and recommendations. Stable post DU bleed, advance diet, follow H/H,  Long term PPI. No plans for re endoscopy unless he re bleeds.  Willa Roughora Eladia Frame,MD [facility] Pager # [phone]

## 2013-12-24 NOTE — Care Management Note (Addendum)
    Page 1 of 1   12/25/2013     11:51:00 AM CARE MANAGEMENT NOTE 12/25/2013  Patient:  Chad Kaiser,Chad Kaiser   Account Number:  [id number]  Date Initiated:  12/24/2013  Documentation initiated by:  Lorenda IshiharaPEELE,Dorie Ohms  Subjective/Objective Assessment:   28 yo male admitted with Melana. PTA lived at home with spouse.     Action/Plan:   Home when stable   Anticipated DC Date:  12/24/2013   Anticipated DC Plan:  HOME/SELF CARE      DC Planning Services  CM consult  Follow-up appt scheduled      Choice offered to / List presented to:             Status of service:  Completed, signed off Medicare Important Message given?   (If response is "NO", the following Medicare IM given date fields will be blank) Date Medicare IM given:   Medicare IM given by:   Date Additional Medicare IM given:   Additional Medicare IM given by:    Discharge Disposition:  HOME/SELF CARE  Per UR Regulation:  Reviewed for med. necessity/level of care/duration of stay  If discussed at Long Length of Stay Meetings, dates discussed:    Comments:  12-25-13 Lorenda IshiharaSuzanne Emylia Latella RN CM 1100 Contacted to help arrange f/u PCP appt. Spoke with patient at bedside, states he does not have a PCP and his insurance is not active yet, recently started a new job. Discussed PCP options with patient, provided information on [facility], patient is agreeable to appt there. Contacted clinic, appt arranged for 12-27-13 at 0900. Provided patient with information about clinic and address. Instructed patient that once insurance was active he could continue with the clinic or contact insurance provider to see who else was in network. Patient appreciative of assistance.

## 2013-12-24 NOTE — Progress Notes (Signed)
[facility] PROGRESS NOTE  Chad MassyRicardo Kaiser AVW:[id number] DOB: 05/09/1986 DOA: 12/23/2013 PCP: No primary provider on file.  Assessment/Plan: Active Problems:  Acute  Duodenal ulcer with hemorrhage - s/p endoscopy with epinephrine injection and clip by GI - awaiting H pylori testing - Please see dispo plan below    GI bleed - 2ary to above - monitor hgb levels   Code Status: full Family Communication: None at bedside Disposition Plan: After protonix infusion for 48 hours will d/c on oral regimen   Consultants:  GI: Dr. Rhea BeltonPyrtle  Procedures:  None  Antibiotics:  Amoxicillin and clarithromycin  HPI/Subjective: The patient has no new complaints. No acute issues overnight  Objective: Filed Vitals:   12/24/13 1400  BP: 122/74  Pulse: 77  Temp: 97.9 F (36.6 C)  Resp: 18    Intake/Output Summary (Last 24 hours) at 12/24/13 1723 Last data filed at 12/24/13 1600  Gross per 24 hour  Intake 1311.75 ml  Output   2625 ml  Net -1313.25 ml   Filed Weights   12/24/13 0028  Weight: 113.399 kg (250 lb)    Exam:   General:  Pt in nad, alert and awake  Cardiovascular: rrr, no mrg  Respiratory: cta bl, no wheezes  Abdomen: soft, NT, ND  Musculoskeletal: no cyanosis or clubbing   Data Reviewed: Basic Metabolic Panel:  Recent Labs Lab 12/23/13 1143  NA 136*  K 4.4  CL 101  CO2 21  GLUCOSE 106*  BUN 23  CREATININE 0.77  CALCIUM 9.5   Liver Function Tests:  Recent Labs Lab 12/23/13 1143  AST 60*  ALT 150*  ALKPHOS 60  BILITOT 0.6  PROT 8.0  ALBUMIN 4.4   No results found for this basename: LIPASE, AMYLASE,  in the last 168 hours No results found for this basename: AMMONIA,  in the last 168 hours CBC:  Recent Labs Lab 12/23/13 1143 12/23/13 1800 12/24/13 0505  WBC 7.7  --  7.9  NEUTROABS 5.1  --   --   HGB 16.8 14.7 13.8  HCT 47.7 42.0 40.8  MCV 85.0  --  86.3  PLT 226  --  199   Cardiac Enzymes: No results found for this  basename: CKTOTAL, CKMB, CKMBINDEX, TROPONINI,  in the last 168 hours BNP (last 3 results) No results found for this basename: PROBNP,  in the last 8760 hours CBG: No results found for this basename: GLUCAP,  in the last 168 hours  No results found for this or any previous visit (from the past 240 hour(s)).   Studies: No results found.  Scheduled Meds: . amoxicillin  500 mg Oral QID  . clarithromycin  500 mg Oral Q12H  . pneumococcal 23 valent vaccine  0.5 mL Intramuscular Tomorrow-1000   Continuous Infusions: . sodium chloride 20 mL/hr at 12/24/13 0206  . pantoprozole (PROTONIX) infusion 8 mg/hr (12/24/13 1356)     Time spent: > 35 minutes    Penny PiaVEGA, Capricia Serda  [facility] Pager [phone] If 7PM-7AM, please contact night-coverage at [email or web address], password [email or web address] 12/24/2013, 5:23 PM  LOS: 1 day

## 2013-12-25 LAB — CBC
HEMATOCRIT: 40 % (ref 39.0–52.0)
HEMOGLOBIN: 13.8 g/dL (ref 13.0–17.0)
MCH: 29.4 pg (ref 26.0–34.0)
MCHC: 34.5 g/dL (ref 30.0–36.0)
MCV: 85.1 fL (ref 78.0–100.0)
PLATELETS: 197 10*3/uL (ref 150–400)
RBC: 4.7 MIL/uL (ref 4.22–5.81)
RDW: 12.9 % (ref 11.5–15.5)
WBC: 6.7 10*3/uL (ref 4.0–10.5)

## 2013-12-25 MED ORDER — PANTOPRAZOLE SODIUM 40 MG PO TBEC: 40.0000 mg | DELAYED_RELEASE_TABLET | Freq: Two times a day (BID) | ORAL | Status: DC

## 2013-12-25 MED ORDER — CLARITHROMYCIN 500 MG PO TABS: 500.0000 mg | ORAL_TABLET | Freq: Two times a day (BID) | ORAL | Status: DC

## 2013-12-25 MED ORDER — AMOXICILLIN 500 MG PO CAPS: 500.0000 mg | ORAL_CAPSULE | Freq: Four times a day (QID) | ORAL | Status: DC

## 2013-12-25 MED ORDER — PNEUMOCOCCAL VAC POLYVALENT 25 MCG/0.5ML IJ INJ
0.5000 mL | INJECTION | INTRAMUSCULAR | Status: AC
  Administered 2013-12-25: 0.5 mL via INTRAMUSCULAR
  Filled 2013-12-25: qty 0.5

## 2013-12-25 NOTE — Progress Notes (Signed)
Patient discharge instructions given,verbalized understanding. No c/o pain. Stable. No s/s of bleeding. - Hulda Marinonna Zhi Geier RN

## 2013-12-25 NOTE — Progress Notes (Signed)
    Progress Note   Subjective  feels okay, no abdominal pain.    Objective   Vital signs in last 24 hours: Temp:  [97.8 F (36.6 C)-98.8 F (37.1 C)] 98 F (36.7 C) (08/11 0559) Pulse Rate:  [77-84] 82 (08/11 0559) Resp:  [18] 18 (08/11 0559) BP: (105-128)/(55-75) 106/57 mmHg (08/11 0559) SpO2:  [97 %-99 %] 98 % (08/11 0559) Last BM Date: 12/24/13 General:    Hispanic male in NAD Heart:  Regular rate and rhythm Abdomen:  Soft, nontender and nondistended. Normal bowel sounds. Neurologic:  Alert and oriented,  grossly normal neurologically. Psych:  Cooperative. Normal mood and affect.  Lab Results:  Recent Labs  12/23/13 1143 12/23/13 1800 12/24/13 0505 12/25/13 0508  WBC 7.7  --  7.9 6.7  HGB 16.8 14.7 13.8 13.8  HCT 47.7 42.0 40.8 40.0  PLT 226  --  199 197   BMET  Recent Labs  12/23/13 1143  NA 136*  K 4.4  CL 101  CO2 21  GLUCOSE 106*  BUN 23  CREATININE 0.77  CALCIUM 9.5   LFT  Recent Labs  12/23/13 1143  PROT 8.0  ALBUMIN 4.4  AST 60*  ALT 150*  ALKPHOS 60  BILITOT 0.6   PT/INR  Recent Labs  12/23/13 1143  LABPROT 13.1  INR 0.99      Assessment / Plan:   1. Upper GI bleed, s/p EGD with injection / clipping of duodenal ulcer. H.pylori positive, amoxicillin and Biaxin started yesterday.  Hgb remained in normal range (13.8 today). Almost completed 72 hours of IV PPI. Will change to PO BID to take x30 days followe by daily x 30 days.   2. Transaminitis, viral hepatitis a,b,c negative.  Should have repeat LFTs in a couple of weeks. He can have this done with PCP or our office, whichever he prefers.     .  LOS: 2 days   Willette Clusteraula Guenther  12/25/2013, 8:49 AM   Attending MD note:   I have taken a history, examined the patient, and reviewed the chart. I agree with the Advanced Practitioner's impression and recommendations. StABLE OK to discharge today on H.Pylori regimen.  Willa Roughora Kristjan Derner,MD [facility] Pager # [phone]

## 2013-12-25 NOTE — Discharge Summary (Signed)
Physician Discharge Summary  Chad Kaiser NFA:[id number] DOB: 09/05/1985 DOA: 12/23/2013  PCP: No primary provider on file.  Admit date: 12/23/2013 Discharge date: 12/25/2013  Time spent: > 35 minutes  Recommendations for Outpatient Follow-up:  1. Please be sure to follow up with your GI specialist or primary care physician 2. Avoid alcohol and NSAIDs  Discharge Diagnoses:  Active Problems:   Duodenal ulcer with hemorrhage   GI bleed   Acute duodenal ulcer with bleeding   Discharge Condition: Stable  Diet recommendation: Regular  Filed Weights   12/24/13 0028  Weight: 113.399 kg (250 lb)    History of present illness:  28 year old who presented after having 2 episodes of melena found to have duodenal ulcer on evaluation  Hospital Course:  Duodenal ulcer -And post endoscopic evaluation showing duodenal ulcer - Patient H. Pylori - GI evaluated and recommended the following: 1. Upper GI bleed, s/p EGD with injection / clipping of duodenal ulcer. H.pylori positive, amoxicillin and Biaxin started yesterday. Hgb remained in normal range (13.8 today). Almost completed 72 hours of IV PPI. Will change to PO BID to take x30 days followe by daily x 30 days.  2. Transaminitis, viral hepatitis a,b,c negative. Should have repeat LFTs in a couple of weeks. He can have this done with PCP or our office, whichever he prefers.   Procedures:  As listed above  Consultations:  GI: Dr. Rhea Belton  Discharge Exam: Filed Vitals:   12/25/13 1000  BP: 134/77  Pulse: 95  Temp: 97.5 F (36.4 C)  Resp: 18    General: pt in nad, alert and awake Cardiovascular: rrr, no mrg Respiratory: CTA BL, no wheezes  Discharge Instructions You were cared for by a hospitalist during your hospital stay. If you have any questions about your discharge medications or the care you received while you were in the hospital after you are discharged, you can call the unit and asked to speak with the hospitalist on  call if the hospitalist that took care of you is not available. Once you are discharged, your primary care physician will handle any further medical issues. Please note that NO REFILLS for any discharge medications will be authorized once you are discharged, as it is imperative that you return to your primary care physician (or establish a relationship with a primary care physician if you do not have one) for your aftercare needs so that they can reassess your need for medications and monitor your lab values.  Discharge Instructions   Call MD for:  redness, tenderness, or signs of infection (pain, swelling, redness, odor or green/yellow discharge around incision site)    Complete by:  As directed      Call MD for:  temperature >100.4    Complete by:  As directed      Diet - low sodium heart healthy    Complete by:  As directed      Discharge instructions    Complete by:  As directed   Please be sure to follow up with your pcp or gastroenterologist in 3-4 weeks post discharge.  Call their office after discharge to set up appointment.     Increase activity slowly    Complete by:  As directed             Medication List         amoxicillin 500 MG capsule  Commonly known as:  AMOXIL  Take 1 capsule (500 mg total) by mouth 4 (four) times daily.  clarithromycin 500 MG tablet  Commonly known as:  BIAXIN  Take 1 tablet (500 mg total) by mouth every 12 (twelve) hours.     pantoprazole 40 MG tablet  Commonly known as:  PROTONIX  Take 1 tablet (40 mg total) by mouth 2 (two) times daily.       No Known Allergies     Follow-up Information   Follow up with [facility]     On 12/27/2013. (Appointment scheduled at 9 am, arrive 15 minutes early)    Contact information:   [address] [place] [id number] [phone]       The results of significant diagnostics from this hospitalization (including imaging, microbiology, ancillary and  laboratory) are listed below for reference.    Significant Diagnostic Studies: No results found.  Microbiology: No results found for this or any previous visit (from the past 240 hour(s)).   Labs: Basic Metabolic Panel:  Recent Labs Lab 12/23/13 1143  NA 136*  K 4.4  CL 101  CO2 21  GLUCOSE 106*  BUN 23  CREATININE 0.77  CALCIUM 9.5   Liver Function Tests:  Recent Labs Lab 12/23/13 1143  AST 60*  ALT 150*  ALKPHOS 60  BILITOT 0.6  PROT 8.0  ALBUMIN 4.4   No results found for this basename: LIPASE, AMYLASE,  in the last 168 hours No results found for this basename: AMMONIA,  in the last 168 hours CBC:  Recent Labs Lab 12/23/13 1143 12/23/13 1800 12/24/13 0505 12/25/13 0508  WBC 7.7  --  7.9 6.7  NEUTROABS 5.1  --   --   --   HGB 16.8 14.7 13.8 13.8  HCT 47.7 42.0 40.8 40.0  MCV 85.0  --  86.3 85.1  PLT 226  --  199 197   Cardiac Enzymes: No results found for this basename: CKTOTAL, CKMB, CKMBINDEX, TROPONINI,  in the last 168 hours BNP: BNP (last 3 results) No results found for this basename: PROBNP,  in the last 8760 hours CBG: No results found for this basename: GLUCAP,  in the last 168 hours     Signed:  Penny PiaVEGA, Alaria Oconnor  [facility] 12/25/2013, 11:59 AM

## 2013-12-25 NOTE — Clinical Documentation Improvement (Signed)
Possible Clinical Conditions?  Duodenal ulcer due to H.pylori Other Condition Cannot Clinically Determine   Supporting Information: duodenal ulcer,  H.pylori positive, amoxicillin and Biaxin started yesterday  Thank You, Nevin BloodgoodJoan B Ashdon Gillson, RN, BSN, CCDS,Clinical Documentation Specialist:  [phone]  [phone]=[employer] Information Management

## 2013-12-25 NOTE — Progress Notes (Signed)
Patieent d/c home,stable.Pamelia Hoit- Wilbert Schouten AlviarRN

## 2013-12-27 ENCOUNTER — Ambulatory Visit: Payer: Self-pay | Attending: Internal Medicine | Admitting: Internal Medicine

## 2013-12-27 ENCOUNTER — Encounter: Payer: Self-pay | Admitting: Internal Medicine

## 2013-12-27 VITALS — BP 133/80 | HR 97 | Temp 99.0°F | Resp 16 | Ht 67.0 in | Wt 251.0 lb

## 2013-12-27 DIAGNOSIS — K274 Chronic or unspecified peptic ulcer, site unspecified, with hemorrhage: Secondary | ICD-10-CM | POA: Insufficient documentation

## 2013-12-27 MED ORDER — PANTOPRAZOLE SODIUM 40 MG PO TBEC: 40.0000 mg | DELAYED_RELEASE_TABLET | Freq: Two times a day (BID) | ORAL | Status: DC

## 2013-12-27 MED ORDER — CLARITHROMYCIN 500 MG PO TABS: 500.0000 mg | ORAL_TABLET | Freq: Two times a day (BID) | ORAL | Status: DC

## 2013-12-27 MED ORDER — AMOXICILLIN 500 MG PO CAPS: 500.0000 mg | ORAL_CAPSULE | Freq: Four times a day (QID) | ORAL | Status: DC

## 2013-12-27 NOTE — Progress Notes (Signed)
Pt comes here to establish care s/p Duodenal Ulcer with Hemorrhage diagnosed 12/20/13 Clot clipped with Iv Protonix Pt is taking prescribed Protonix,Clarithromycin and Amoxicillin Pt denies abdominal pain,n,v or fever Wife is interpretor per [place]

## 2013-12-27 NOTE — Progress Notes (Signed)
Patient ID: Chad Kaiser, male   DOB: 07-29-1985, 28 y.o.   MRN: [id number]   Chad Kaiser, is a 28 y.o. male  WUJ:[id number]  NFA:[id number]  DOB - 1985/10/31  CC:  Chief Complaint  Patient presents with  . Hospitalization Follow-up    Suodenal Ulcer with Hemorrhage       HPI: Chad Kaiser is a 28 y.o. male here today to establish medical care. Patient was diagnosed with Dieulafoy lesion approx. 2 years ago in [place]. He was admitted and underwent endoscopic cauterization and he has had no problems since that time. Recently the patient was seen in the ER again for dark tarry stool similar to his previous episode, he was again admitted, had endoscopic evaluation with the following findings: "Blood and clot was found in the stomach obscuring complete visualization, but no gastric bleeding lesions seen, Actively bleeding ulcer measuring 4 x 6mm in size was found in the duodenal bulb; Complete hemostasis was achieved by epinephrine injection and by placing two hemoclips on the bleeding site". Patient was discharged on triple antibiotics after finding positive H. pylori serology, he was given this appointment for hospital followup. He claims to be doing well since endoscopic intervention, no more bleeding or dark tarry stool, no epigastric or abdominal pain. Patient does not smoke cigarette, she drinks alcohol occasionally.  Patient has No headache, No chest pain,- No Nausea, No new weakness tingling or numbness, No Cough - SOB.  No Known Allergies Past Medical History  Diagnosis Date  . Dieulafoy ulcer 2013   No current outpatient prescriptions on file prior to visit.   No current facility-administered medications on file prior to visit.   History reviewed. No pertinent family history. History   Social History  . Marital Status: Single    Spouse Name: N/A    Number of Children: N/A  . Years of Education: N/A   Occupational History  . Not on file.   Social History Main Topics  .  Smoking status: Never Smoker   . Smokeless tobacco: Not on file  . Alcohol Use: No  . Drug Use: Not on file  . Sexual Activity: Not on file   Other Topics Concern  . Not on file   Social History Narrative  . No narrative on file    Review of Systems: Constitutional: Negative for fever, chills, diaphoresis, activity change, appetite change and fatigue. HENT: Negative for ear pain, nosebleeds, congestion, facial swelling, rhinorrhea, neck pain, neck stiffness and ear discharge.  Eyes: Negative for pain, discharge, redness, itching and visual disturbance. Respiratory: Negative for cough, choking, chest tightness, shortness of breath, wheezing and stridor.  Cardiovascular: Negative for chest pain, palpitations and leg swelling. Gastrointestinal: Negative for abdominal distention. Genitourinary: Negative for dysuria, urgency, frequency, hematuria, flank pain, decreased urine volume, difficulty urinating and dyspareunia.  Musculoskeletal: Negative for back pain, joint swelling, arthralgia and gait problem. Neurological: Negative for dizziness, tremors, seizures, syncope, facial asymmetry, speech difficulty, weakness, light-headedness, numbness and headaches.  Hematological: Negative for adenopathy. Does not bruise/bleed easily. Psychiatric/Behavioral: Negative for hallucinations, behavioral problems, confusion, dysphoric mood, decreased concentration and agitation.    Objective:   Filed Vitals:   12/27/13 0946  BP: 133/80  Pulse: 97  Temp: 99 F (37.2 C)  Resp: 16    Physical Exam: Constitutional: Patient appears well-developed and well-nourished. No distress. HENT: Normocephalic, atraumatic, External right and left ear normal. Oropharynx is clear and moist.  Eyes: Conjunctivae and EOM are normal. PERRLA, no scleral icterus. Neck: Normal ROM. Neck  supple. No JVD. No tracheal deviation. No thyromegaly. CVS: RRR, S1/S2 +, no murmurs, no gallops, no carotid bruit.  Pulmonary:  Effort and breath sounds normal, no stridor, rhonchi, wheezes, rales.  Abdominal: Soft. BS +, no distension, tenderness, rebound or guarding.  Musculoskeletal: Normal range of motion. No edema and no tenderness.  Lymphadenopathy: No lymphadenopathy noted, cervical, inguinal or axillary Neuro: Alert. Normal reflexes, muscle tone coordination. No cranial nerve deficit. Skin: Skin is warm and dry. No rash noted. Not diaphoretic. No erythema. No pallor. Psychiatric: Normal mood and affect. Behavior, judgment, thought content normal.  Lab Results  Component Value Date   WBC 6.7 12/25/2013   HGB 13.8 12/25/2013   HCT 40.0 12/25/2013   MCV 85.1 12/25/2013   PLT 197 12/25/2013   Lab Results  Component Value Date   CREATININE 0.77 12/23/2013   BUN 23 12/23/2013   NA 136* 12/23/2013   K 4.4 12/23/2013   CL 101 12/23/2013   CO2 21 12/23/2013    No results found for this basename: HGBA1C   Lipid Panel  No results found for this basename: chol, trig, hdl, cholhdl, vldl, ldlcalc       Assessment and plan:   1. Peptic ulcer disease with hemorrhage and positive H. pylori Continue - pantoprazole (PROTONIX) 40 MG tablet; Take 1 tablet (40 mg total) by mouth 2 (two) times daily.  Dispense: 60 tablet; Refill: 0 - clarithromycin (BIAXIN) 500 MG tablet; Take 1 tablet (500 mg total) by mouth every 12 (twelve) hours.  Dispense: 20 tablet; Refill: 0 - amoxicillin (AMOXIL) 500 MG capsule; Take 1 capsule (500 mg total) by mouth 4 (four) times daily.  Dispense: 40 capsule; Refill: 0  Patient was counseled extensively about nutrition and exercise Please do not take NSAID Interpreter was used to communicate directly with patient for the entire encounter including providing detailed patient instructions.   Return in about 6 months (around 06/29/2014), or if symptoms worsen or fail to improve, for Abdominal Pain.  The patient was given clear instructions to go to ER or return to medical center if symptoms don't  improve, worsen or new problems develop. The patient verbalized understanding. The patient was told to call to get lab results if they haven't heard anything in the next week.     This note has been created with Education officer, environmentalDragon speech recognition software and smart phrase technology. Any transcriptional errors are unintentional.    Jeanann LewandowskyJEGEDE, Arushi Partridge, MD, MHA, FACP, FAAP [facility] [place] [phone]   12/27/2013, 9:58 AM

## 2013-12-27 NOTE — Patient Instructions (Signed)
Anlisis de anticuerpos a Helicobacter Pylori (Helicobacter Pylori Antibodies Test) Se trata de un anlisis de sangre que busca la presencia de una bacteria llamada Helicobacter Pylori. Esto puede diagnosticarse tambin mediante una prueba de [place] o un examen microscpico de una parte (biopsia) del intestino delgado. H. pylori es una bacteria que se encuentra en las clulas que recubren el [place]. Es un factor de riesgo para las Risk manager y en el intestino delgado, inflamacin prolongada del recubrimiento del [place], o incluso lceras que pueden ocurrir en el esfago (la va que va desde la boca al [place]). Esta bacteria es tambin un factor de riesgo de cncer de estmago. La bacteria que se encuentra en, aproximadamente, el 10% de las personas sanas menores de 58 aos de edad, y la cantidad de bacteria aumenta con la edad. La [employer] de las personas que tienen esta bacteria no experimentan sntomas; no obstante, se considera que, cuando esta bacteria ocasiona lceras, se pueden usar antibiticos para eliminar o reducir el problema.  PREPARACIN PARA EL ESTUDIO No se requiere una preparacin especial ni ayunar. HALLAZGOS NORMALES Negativo (no se detect la presencia de la bacteria H-pylori) Los rangos para los resultados normales pueden variar entre diferentes laboratorios y hospitales. Consulte siempre con su mdico despus de Psychologist, counselling estudio para Armed forces logistics/support/administrative officer significado de los [place] y si los valores se consideran "dentro de los lmites normales". SIGNIFICADO DEL ESTUDIO  El mdico leer los resultados y Electrical engineer con usted sobre la importancia y el significado de los [place], as como las opciones de tratamiento y la necesidad de Optometrist pruebas adicionales, si fuera necesario. Es su responsabilidad retirar el resultado del [place]. Consulte en el laboratorio cundo y cmo podr [employer]. OBTENCIN DE LOS RESULTADOS DE LAS PRUEBAS Es su responsabilidad  retirar el resultado del [place]. Consulte en el laboratorio cundo y cmo podr [employer]. Document Released: 02/21/2013 [facility] Patient Information 2015 [place]. This information is not intended to replace advice given to you by your health care provider. Make sure you discuss any questions you have with your health care provider. lcera pptica  (Peptic Ulcer)  La lcera pptica es una llaga dolorosa en la membrana que recubre el esfago (lcera esofgica), el estmago (lcera gstrica), o la primera parte del intestino delgado (lcera duodenal). La lcera causa erosin en los tejidos profundos.  CAUSAS  Normalmente, el revestimiento del estmago y del intestino delgado se protegen a s mismos del cido con que se digieren los alimentos. El revestimiento protector puede daarse debido a:   Una infeccin causada por una bacteria llamada Helicobacter pylori (H. pylori).  El uso regular de medicamentos anti-inflamatorios no esteroides (AINE), como el ibuprofeno o la aspirina.  El consumo de tabaco. Otros factores de riesgo incluyen ser mayor de 41 aos, el consumo de alcohol en exceso y [place] antecedentes familiares de lcera.  SNTOMAS   Dolor quemante o punzante en la zona entre el pecho y el ombligo.  Acidez.  Nuseas y vmitos.  Hinchazn. El dolor empeora con el estmago vaco y por la noche. Si la lcera sangra, puede causar:   Materia fecal de color negro alquitranado.  Vmito de sangre roja brillante.  Vmito de aspecto similar a la borra del caf. DIAGNSTICO  El diagnstico se realiza basndose en la historia clnica y el examen fsico. Para encontrar las causas de la lcera podrn indicarle otros estudios y procedimientos. Encontrar la causa ayudar a Adult nurse. Los estudios y procedimientos  pueden incluir:   Anlisis de sangre, anlisis de materia fecal, o estudios del aliento para Engineer, manufacturingdetectar la bacteria H. pylori.  Una seriada  del tracto gastrointestinal (GI) del esfago, el estmago y el intestino delgado.  Una endoscopia para examinar el esfago, el estmago y el intestino delgado.  Una biopsia. TRATAMIENTO  El tratamiento incluye:   La eliminacin de la causa de la lcera, como el tabaquismo, los DahlgrenAINE o el alcohol.  Medicamentos para reducir la cantidad de cido en el tracto digestivo.  Antibiticos si la causa de la lcera es la bacteria H. pylori.  Una endoscopia superior para tratar Rolan Lipauna lcera sangrante.  Ciruga si el sangrado es grave o si la lcera ha perforado Event organiseralgn lugar en el sistema digestivo. INSTRUCCIONES PARA EL CUIDADO EN EL HOGAR   Evite el tabaco, el alcohol y la cafena. El fumar puede aumentar el cido en el estmago y el tabaquismo continuado no favorecer la curacin de las lceras.  Evite los alimentos y las bebidas que le parece que le causan molestias o que le agravan su lcera.  Tome slo la medicacin que le indic el profesional. No tome sustitutos de venta libre de los medicamentos recetados sin Science writerconsultar a su mdico.  Cumpla con las consultas de control y hgase los estudios segn las indicaciones. SOLICITE ATENCIN MDICA SI:   La infeccin no mejora dentro de los 7 [address]  [address] despus de Programmer, systemsiniciar el tratamiento.  Siente indigestin o [place] Islandsacidez de manera continua. SOLICITE ATENCIN MDICA DE INMEDIATO SI:   Siente un dolor repentino y agudo o persistente en el abdomen.  La materia fecal es sanguinolenta o negra, de aspecto alquitranado.  Vomita sangre o el vmito tiene el aspecto similar a la borra del caf.  Si se siente mareado, dbil o que va a desmayarse.  Se siente transpirado o sudoroso. ASEGRESE DE QUE:   Comprende estas instrucciones.  Controlar su enfermedad.  Solicitar ayuda de inmediato si no mejora o si empeora. Document Released: 02/10/2005 Document Revised: 01/26/2012 [facility] Patient Information 2015 [place], [place]. This information is not intended  to replace advice given to you by your health care provider. Make sure you discuss any questions you have with your health care provider.

## 2014-01-01 ENCOUNTER — Telehealth: Payer: Self-pay | Admitting: *Deleted

## 2014-01-01 ENCOUNTER — Telehealth: Payer: Self-pay | Admitting: Internal Medicine

## 2014-01-01 DIAGNOSIS — K274 Chronic or unspecified peptic ulcer, site unspecified, with hemorrhage: Secondary | ICD-10-CM

## 2014-01-01 MED ORDER — PANTOPRAZOLE SODIUM 40 MG PO TBEC: 40.0000 mg | DELAYED_RELEASE_TABLET | Freq: Two times a day (BID) | ORAL | Status: DC

## 2014-01-01 MED ORDER — CLARITHROMYCIN 500 MG PO TABS: 500.0000 mg | ORAL_TABLET | Freq: Two times a day (BID) | ORAL | Status: DC

## 2014-01-01 MED ORDER — AMOXICILLIN 500 MG PO CAPS: 500.0000 mg | ORAL_CAPSULE | Freq: Four times a day (QID) | ORAL | Status: DC

## 2014-01-01 NOTE — Telephone Encounter (Signed)
I spoke to the pt and reordered his medication for him to pick up. Pt said that [facility] would [facility] to [facility] in [place] and have them transfer the RX's so he can pick them up.

## 2014-01-01 NOTE — Telephone Encounter (Signed)
Pt had office visit on 8/13 and was prescribed three medications. He filled them at [facility] in [place] but left for [place] due to work and on his way he lost them. Pt is requesting that a new script be sent to the [facility] in [place]. He is worried because some of the medications are antibiotics. [facility] address: [address] [place], [place] [address] and phone number, [phone]. Please follow up with pt.

## 2014-01-09 ENCOUNTER — Other Ambulatory Visit: Payer: Self-pay | Admitting: *Deleted

## 2014-01-09 DIAGNOSIS — K274 Chronic or unspecified peptic ulcer, site unspecified, with hemorrhage: Secondary | ICD-10-CM

## 2014-01-09 MED ORDER — AMOXICILLIN 500 MG PO CAPS: 500.0000 mg | ORAL_CAPSULE | Freq: Four times a day (QID) | ORAL | Status: DC

## 2014-02-04 ENCOUNTER — Encounter: Payer: Self-pay | Admitting: Internal Medicine

## 2014-02-04 ENCOUNTER — Ambulatory Visit: Payer: BC Managed Care – PPO | Attending: Internal Medicine | Admitting: Internal Medicine

## 2014-02-04 VITALS — BP 134/81 | HR 100 | Temp 98.4°F | Resp 16 | Ht 71.0 in | Wt 243.0 lb

## 2014-02-04 DIAGNOSIS — K274 Chronic or unspecified peptic ulcer, site unspecified, with hemorrhage: Secondary | ICD-10-CM

## 2014-02-04 DIAGNOSIS — Z0000 Encounter for general adult medical examination without abnormal findings: Secondary | ICD-10-CM | POA: Insufficient documentation

## 2014-02-04 DIAGNOSIS — Z792 Long term (current) use of antibiotics: Secondary | ICD-10-CM | POA: Insufficient documentation

## 2014-02-04 DIAGNOSIS — Z79899 Other long term (current) drug therapy: Secondary | ICD-10-CM | POA: Insufficient documentation

## 2014-02-04 DIAGNOSIS — G4733 Obstructive sleep apnea (adult) (pediatric): Secondary | ICD-10-CM

## 2014-02-04 MED ORDER — PANTOPRAZOLE SODIUM 40 MG PO TBEC: 40.0000 mg | DELAYED_RELEASE_TABLET | Freq: Two times a day (BID) | ORAL | Status: DC

## 2014-02-04 NOTE — Progress Notes (Signed)
Pt is here following up after having a peptic ulcer. Pt is requesting a physical.

## 2014-02-04 NOTE — Patient Instructions (Signed)
Apnea del sueo  (Sleep Apnea)  La apnea del sueo es un trastorno caracterizado por pausas anormales en la respiracin durante el sueo. Cuando la respiracin se detiene, Air cabin crew de oxgeno en la sangre disminuye. Esto hace que salga del sueo profundo y [place] en el sueo ligero. [employer], la calidad del sueo es pobre y el sistema que transporta la sangre a travs del cuerpo (sistema cardiovascular) experimenta estrs. Si la apnea del sueo no se trata, Building services engineer los siguientes problemas:   Presin arterial elevada (hipertensin).  Enfermedad coronaria.  Incapacidad para alcanzar o Designer, fashion/clothing (impotencia).  Deterioro de los procesos de pensamiento (disfuncin cognitiva). Existen tres tipos de apnea del sueo.  1. Apnea del sueo obstructiva - Pausas en la respiracin durante el sueo debido a una obstruccin de las vas areas. 2. Apnea del sueo central -Pausas en la respiracin durante el sueo debido a que la zona del cerebro que controla la respiracin no enva las seales correctas a los msculos que la controlan. 3. Apnea del sueo mixta - [place] combinacin de [place], tanto obstructiva como central. FACTORES DE RIESGO  Sin embargo, los siguientes factores pueden aumentar el riesgo de desarrollar apnea del sueo.   Sobrepeso.  El hbito de fumar.  Pasajes estrechos en la nariz y la garganta.  Edad avanzada.  Sexo masculino.  El consumo de alcohol.  Uso de sedantes y tranquilizantes.  La etnia. Entre los individuos menores de 35 aos, los afroamericanos tienen ms riesgo. SNTOMAS   Dificultad para permanecer dormido.  Somnolencia y Doctor, hospital.  Prdida de energa.  Irritabilidad.  Fuertes ronquidos.  Dolores de cabeza matutinos.  Dificultad para concentrarse.  Olvidos.  Disminucin del inters por el sexo. DIAGNSTICO  Para diagnosticar la apnea del sueo, Public librarian un examen fsico. Su mdico podr indicarle un  estudio del sueo para Transport planner. Tambin podr indicarle que pase la noche en un laboratorio del sueo. En el laboratorio del sueo, varios monitores registran informacin sobre el corazn, los pulmones y el cerebro durante el sueo. Tambin se registran los movimientos de brazos y [place], y Air cabin crew de oxgeno en la sangre.  TRATAMIENTO  Las acciones siguientes pueden ayudar a Oncologist la apnea del sueo leve:   Duerma de lado.   Use un descongestivo, tiene congestin nasal.   Evite el uso de depresores, incluyendo el alcohol, sedantes y narcticos.   Baje de peso y modifique su dieta si tiene sobrepeso. Existen dispositivos y tratamientos para ayudar a abrir las vas respiratorias:   Dispositivos bucales. Son boquillas a medida que modifican ligeramente la posicin de la mandbula inferior [place] adelante y abren la mordida. Esto permite la apertura de las vas respiratorias.  Dispositivos que generan presin positiva en las vas areas. Esta presin positiva [employer] vas respiratorias para ayudar a Therapist, nutritional sueo. Los siguientes dispositivos crean una presin area positiva:  Dispositivo de presin positiva continua de las vas respiratorias (CPAP). Este dispositivo CPAP crea un nivel continuo de presin de aire con [place] bomba de aire. El aire se enva a las vas respiratorias a travs de una mscara durante el sueo. Esta presin continua mantiene las vas areas [place].  Dispositivo de presin espiratoria positiva en las vas areas (EPAP). El dispositivo EPAP crea una presin positiva de aire a medida que se exhala. Consta de vlvulas de un solo uso que se insertan en cada fosa nasal y las mantienen en su  sitio mediante un [employer]. Las vlvulas crean muy poca resistencia al inhalar pero crean resistencia al exhalar. Ese aumento de la resistencia crea una presin de [place]. Esta presin positiva al exhalar mantiene abiertas las vas  respiratorias, lo que facilita la respiracin al inhalar nuevamente.  Dispositivo de presin positiva de [place] [employer] vas areas (BPAP). El dispositivo BPAP se utiliza principalmente en pacientes con apnea del sueo central. Este dispositivo es similar al dispositivo de CPAP ya que utiliza una bomba de aire para suministrar presin de aire continua a travs de Tourist information centre manager. Sin embargo, con el dispositivo BPAP la presin se Naval architect. Al exhalar, la presin es inferior a la presin que se establece al [employer].  Ciruga. Por lo general, la ciruga slo se realiza si no puede cumplir con los tratamientos menos invasivos o si estos no mejoran su requisito. La ciruga consiste en extraer el exceso de tejidos en las vas areas para crear un pasaje ms amplio. Document Released: 02/10/2005 Document Revised: 08/28/2012 [facility] Patient Information 2015 [place], [place]. This information is not intended to replace advice given to you by your health care provider. Make sure you discuss any questions you have with your health care provider. lcera pptica  (Peptic Ulcer)  La lcera pptica es una llaga dolorosa en la membrana que recubre el esfago (lcera esofgica), el estmago (lcera gstrica), o la primera parte del intestino delgado (lcera duodenal). La lcera causa erosin en los tejidos profundos.  CAUSAS  Normalmente, el revestimiento del estmago y del intestino delgado se protegen a s mismos del cido con que se digieren los alimentos. El revestimiento protector puede daarse debido a:   Una infeccin causada por una bacteria llamada Helicobacter pylori (H. pylori).  El uso regular de medicamentos anti-inflamatorios no esteroides (AINE), como el ibuprofeno o la aspirina.  El consumo de tabaco. Otros factores de riesgo incluyen ser mayor de 50 aos, el consumo de alcohol en exceso y [place] antecedentes familiares de lcera.  SNTOMAS   Dolor quemante o punzante  en la zona entre el pecho y el ombligo.  Acidez.  Nuseas y vmitos.  Hinchazn. El dolor empeora con el estmago vaco y por la noche. Si la lcera sangra, puede causar:   Materia fecal de color negro alquitranado.  Vmito de sangre roja brillante.  Vmito de aspecto similar a la borra del caf. DIAGNSTICO  El diagnstico se realiza basndose en la historia clnica y el examen fsico. Para encontrar las causas de la lcera podrn indicarle otros estudios y procedimientos. Encontrar la causa ayudar a Futures trader. Los estudios y procedimientos pueden incluir:   Anlisis de sangre, anlisis de materia fecal, o estudios del aliento para Engineer, manufacturing la bacteria H. pylori.  Una seriada del tracto gastrointestinal (GI) del esfago, el estmago y el intestino delgado.  Una endoscopia para examinar el esfago, el estmago y el intestino delgado.  Una biopsia. TRATAMIENTO  El tratamiento incluye:   La eliminacin de la causa de la lcera, como el tabaquismo, los [place] o el alcohol.  Medicamentos para reducir la cantidad de cido en el tracto digestivo.  Antibiticos si la causa de la lcera es la bacteria H. pylori.  Una endoscopia superior para tratar Rolan Lipa sangrante.  Ciruga si el sangrado es grave o si la lcera ha perforado Event organiser en el sistema digestivo. INSTRUCCIONES PARA EL CUIDADO EN EL HOGAR   Evite el tabaco, el alcohol y la cafena. El fumar puede  aumentar el cido en el estmago y el tabaquismo continuado no favorecer la curacin de las lceras.  Evite los alimentos y las bebidas que le parece que le causan molestias o que le agravan su lcera.  Tome slo la medicacin que le indic el profesional. No tome sustitutos de venta libre de los medicamentos recetados sin Science writer a su mdico.  Cumpla con las consultas de control y hgase los estudios segn las indicaciones. SOLICITE ATENCIN MDICA SI:   La infeccin no mejora dentro de los 7  [address]  [address] despus de Programmer, systems.  Siente indigestin o [place] continua. SOLICITE ATENCIN MDICA DE INMEDIATO SI:   Siente un dolor repentino y agudo o persistente en el abdomen.  La materia fecal es sanguinolenta o negra, de aspecto alquitranado.  Vomita sangre o el vmito tiene el aspecto similar a la borra del caf.  Si se siente mareado, dbil o que va a desmayarse.  Se siente transpirado o sudoroso. ASEGRESE DE QUE:   Comprende estas instrucciones.  Controlar su enfermedad.  Solicitar ayuda de inmediato si no mejora o si empeora. Document Released: 02/10/2005 Document Revised: 01/26/2012 [facility] Patient Information 2015 [place], [place]. This information is not intended to replace advice given to you by your health care provider. Make sure you discuss any questions you have with your health care provider.

## 2014-02-04 NOTE — Progress Notes (Signed)
Patient ID: Chad Kaiser, male   DOB: 19-Mar-1986, 28 y.o.   MRN: [id number]   Dupree Givler, is a 28 y.o. male  [id number]  [id number]  DOB - 03/02/1986  Chief Complaint  Patient presents with  . Follow-up        Subjective:   Chad Kaiser is a 28 y.o. male here today for a follow up visit. 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 Patient has no complaint today. He would like to have some blood work done. Patient has No headache, No chest pain, No abdominal pain - No Nausea, No new weakness tingling or numbness, No Cough - SOB. Patient admits that he snores loudly at night, does not get enough rest from sleeping.  No problems updated.  ALLERGIES: No Known Allergies  PAST MEDICAL HISTORY: Past Medical History  Diagnosis Date  . Dieulafoy ulcer 2013    MEDICATIONS AT HOME: Prior to Admission medications   Medication Sig Start Date End Date Taking? Authorizing Provider  pantoprazole (PROTONIX) 40 MG tablet Take 1 tablet (40 mg total) by mouth 2 (two) times daily. 02/04/14  Yes Quentin Angst, MD  amoxicillin (AMOXIL) 500 MG capsule Take 1 capsule (500 mg total) by mouth 4 (four) times daily. 01/09/14   Quentin Angst, MD  clarithromycin (BIAXIN) 500 MG tablet Take 1 tablet (500 mg total) by mouth every 12 (twelve) hours. 01/01/14   Quentin Angst, MD     Objective:   Filed Vitals:   02/04/14 1418  BP: 134/81  Pulse: 100  Temp: 98.4 F (36.9 C)  TempSrc: Oral  Resp: 16  Height:  (1.803 m)  Weight: 243 lb (110.224 kg)  SpO2: 95%    Exam General appearance : Awake, alert, not in any distress. Speech Clear. Not toxic looking HEENT: Atraumatic and Normocephalic, pupils equally reactive to light and accomodation Neck: supple, no JVD. No cervical lymphadenopathy.  Chest:Good air entry bilaterally, no added sounds  CVS: S1 S2 regular, no murmurs.  Abdomen: Bowel sounds  present, Non tender and not distended with no gaurding, rigidity or rebound. Extremities: B/L Lower Ext shows no edema, both legs are warm to touch Neurology: Awake alert, and oriented X 3, CN II-XII intact, Non focal  Data Review No results found for this basename: HGBA1C     Assessment & Plan   1. Peptic ulcer disease with hemorrhage  - pantoprazole (PROTONIX) 40 MG tablet; Take 1 tablet (40 mg total) by mouth 2 (two) times daily.  Dispense: 90 tablet; Refill: 3  - CBC with Differential - COMPLETE METABOLIC PANEL WITH GFR - POCT glycosylated hemoglobin (Hb A1C) - Lipid panel - TSH  2. OSA (obstructive sleep apnea)  - Split night study; Future  Interpreter was used to communicate directly with patient for the entire encounter including providing detailed patient instructions.   Return in about 6 months (around 08/05/2014), or if symptoms worsen or fail to improve, for PUD, Abdominal Pain.  The patient was given clear instructions to go to ER or return to medical center if symptoms don't improve, worsen or new problems develop. The patient verbalized understanding. The patient was told to call to get lab results if they haven't heard anything in the next week.   This note has been created with Education officer, environmental. Any transcriptional errors are unintentional.    Tavi Hoogendoorn, MD, MHA, FACP, FAAP  [facility]  [facility] [place], [place] [phone]   02/04/2014, 2:54 PM

## 2014-02-05 LAB — CBC WITH DIFFERENTIAL/PLATELET
BASOS PCT: 1 % (ref 0–1)
Basophils Absolute: 0.1 10*3/uL (ref 0.0–0.1)
EOS PCT: 2 % (ref 0–5)
Eosinophils Absolute: 0.2 10*3/uL (ref 0.0–0.7)
HEMATOCRIT: 46 % (ref 39.0–52.0)
HEMOGLOBIN: 15.8 g/dL (ref 13.0–17.0)
Lymphocytes Relative: 27 % (ref 12–46)
Lymphs Abs: 2.4 10*3/uL (ref 0.7–4.0)
MCH: 27.4 pg (ref 26.0–34.0)
MCHC: 34.3 g/dL (ref 30.0–36.0)
MCV: 79.7 fL (ref 78.0–100.0)
MONO ABS: 0.3 10*3/uL (ref 0.1–1.0)
MONOS PCT: 3 % (ref 3–12)
NEUTROS ABS: 5.9 10*3/uL (ref 1.7–7.7)
Neutrophils Relative %: 67 % (ref 43–77)
Platelets: 285 10*3/uL (ref 150–400)
RBC: 5.77 MIL/uL (ref 4.22–5.81)
RDW: 13.7 % (ref 11.5–15.5)
WBC: 8.8 10*3/uL (ref 4.0–10.5)

## 2014-02-05 LAB — COMPLETE METABOLIC PANEL WITH GFR
ALK PHOS: 63 U/L (ref 39–117)
ALT: 127 U/L — ABNORMAL HIGH (ref 0–53)
AST: 48 U/L — ABNORMAL HIGH (ref 0–37)
Albumin: 4.7 g/dL (ref 3.5–5.2)
BUN: 14 mg/dL (ref 6–23)
CALCIUM: 10 mg/dL (ref 8.4–10.5)
CHLORIDE: 102 meq/L (ref 96–112)
CO2: 27 mEq/L (ref 19–32)
Creat: 0.89 mg/dL (ref 0.50–1.35)
GFR, Est African American: >89 mL/min
GLUCOSE: 99 mg/dL (ref 70–99)
POTASSIUM: 4.2 meq/L (ref 3.5–5.3)
Sodium: 139 mEq/L (ref 135–145)
Total Bilirubin: 0.5 mg/dL (ref 0.2–1.2)
Total Protein: 7.5 g/dL (ref 6.0–8.3)

## 2014-02-05 LAB — LIPID PANEL
CHOLESTEROL: 144 mg/dL (ref 0–200)
HDL: 29 mg/dL — ABNORMAL LOW (ref >39)
LDL Cholesterol: 65 mg/dL (ref 0–99)
Total CHOL/HDL Ratio: 5 Ratio
Triglycerides: 251 mg/dL — ABNORMAL HIGH (ref <150)
VLDL: 50 mg/dL — ABNORMAL HIGH (ref 0–40)

## 2014-02-05 LAB — TSH: TSH: 1.174 u[IU]/mL (ref 0.350–4.500)

## 2014-02-14 ENCOUNTER — Telehealth: Payer: Self-pay | Admitting: Emergency Medicine

## 2014-02-14 NOTE — Telephone Encounter (Signed)
Left message on VM with pt lab results and diet/exercise instructions per Spanish interpretor

## 2014-04-19 ENCOUNTER — Encounter (HOSPITAL_BASED_OUTPATIENT_CLINIC_OR_DEPARTMENT_OTHER): Payer: BC Managed Care – PPO

## 2014-06-30 ENCOUNTER — Ambulatory Visit (HOSPITAL_BASED_OUTPATIENT_CLINIC_OR_DEPARTMENT_OTHER): Payer: Self-pay | Attending: Internal Medicine

## 2014-07-02 ENCOUNTER — Ambulatory Visit: Payer: BC Managed Care – PPO | Admitting: Internal Medicine

## 2020-08-04 DIAGNOSIS — F418 Other specified anxiety disorders: Secondary | ICD-10-CM | POA: Diagnosis not present

## 2020-08-05 DIAGNOSIS — F418 Other specified anxiety disorders: Secondary | ICD-10-CM | POA: Diagnosis not present

## 2021-07-10 DIAGNOSIS — Z6841 Body Mass Index (BMI) 40.0 and over, adult: Secondary | ICD-10-CM | POA: Diagnosis not present

## 2021-07-10 DIAGNOSIS — E669 Obesity, unspecified: Secondary | ICD-10-CM | POA: Diagnosis not present

## 2021-07-10 DIAGNOSIS — R400 Somnolence: Secondary | ICD-10-CM | POA: Diagnosis not present

## 2021-07-10 DIAGNOSIS — L989 Disorder of the skin and subcutaneous tissue, unspecified: Secondary | ICD-10-CM | POA: Diagnosis not present

## 2021-07-14 ENCOUNTER — Other Ambulatory Visit (HOSPITAL_BASED_OUTPATIENT_CLINIC_OR_DEPARTMENT_OTHER): Payer: Self-pay

## 2021-07-14 DIAGNOSIS — R400 Somnolence: Secondary | ICD-10-CM

## 2021-08-07 ENCOUNTER — Institutional Professional Consult (permissible substitution): Payer: BC Managed Care – PPO | Admitting: Primary Care

## 2021-08-07 DIAGNOSIS — D485 Neoplasm of uncertain behavior of skin: Secondary | ICD-10-CM | POA: Diagnosis not present

## 2021-08-07 DIAGNOSIS — L649 Androgenic alopecia, unspecified: Secondary | ICD-10-CM | POA: Diagnosis not present

## 2021-08-07 DIAGNOSIS — D2361 Other benign neoplasm of skin of right upper limb, including shoulder: Secondary | ICD-10-CM | POA: Diagnosis not present

## 2021-08-20 ENCOUNTER — Ambulatory Visit (HOSPITAL_BASED_OUTPATIENT_CLINIC_OR_DEPARTMENT_OTHER): Payer: BC Managed Care – PPO | Attending: Physician Assistant | Admitting: Internal Medicine

## 2021-08-20 VITALS — Ht 70.0 in | Wt 290.0 lb

## 2021-08-20 DIAGNOSIS — G4733 Obstructive sleep apnea (adult) (pediatric): Secondary | ICD-10-CM | POA: Insufficient documentation

## 2021-08-20 DIAGNOSIS — R400 Somnolence: Secondary | ICD-10-CM

## 2021-08-20 DIAGNOSIS — I493 Ventricular premature depolarization: Secondary | ICD-10-CM | POA: Insufficient documentation

## 2021-09-05 DIAGNOSIS — R400 Somnolence: Secondary | ICD-10-CM

## 2021-09-05 NOTE — Procedures (Signed)
? ? ?Patient Name: Chad Kaiser, Chad Kaiser ?Study Date: 08/20/2021 ?Gender: Male ?D.O.B: February 11, 1986 ?Age (years): 29 ?Referring Provider: Stana Bunting PA-C ?Height (inches): 70 ?Interpreting Physician: Baird Lyons MD, ABSM ?Weight (lbs): 290 ?RPSGT: Baxter Flattery ?BMI: 42 ?MRN: [id number] ?Neck Size: 20.50 ? ?CLINICAL INFORMATION ?The patient is referred for a split night study with BPAP. ? ?MEDICATIONS ?Medications self-administered by patient taken the night of the study : none reported ? ?SLEEP STUDY TECHNIQUE ?As per the AASM Manual for the Scoring of Sleep and Associated Events v2.3 (April 2016) with a hypopnea requiring 4% desaturations. ? ?The channels recorded and monitored were frontal, central and occipital EEG, electrooculogram (EOG), submentalis EMG (chin), nasal and oral airflow, thoracic and abdominal wall motion, anterior tibialis EMG, snore microphone, electrocardiogram, and pulse oximetry. Bi-level positive airway pressure (BiPAP) was initiated when the patient met split night criteria and was titrated according to treat sleep-disordered breathing. ? ?RESPIRATORY PARAMETERS ?Diagnostic ? ?Total AHI (/hr): 93.1 RDI (/hr): 93.1 OA Index (/hr): 88.8 CA Index (/hr): 0.0 ?REM AHI (/hr): 74.3 NREM AHI (/hr): 95.9 Supine AHI (/hr): 93.1 Non-supine AHI (/hr): N/A ?Min O2 Sat (%): 52.0 Mean O2 (%): 83.9 Time below 88% (min): 124.3  ? ?Titration ? ?Optimal IPAP Pressure (cm): 16 Optimal EPAP Pressure (cm): 12 AHI at Optimal Pressure (/hr): 5.7 Min O2 at Optimal Pressure (%): 88.0 ?Sleep % at Optimal (%): 100 Supine % at Optimal (%): 100  ? ?SLEEP ARCHITECTURE ?The study was initiated at 10:01:31 PM and terminated at 4:38:00 AM. The total recorded time was 396.5 minutes. EEG confirmed total sleep time was 386.9 minutes yielding a sleep efficiency of 97.6%%. Sleep onset after lights out was 3.0 minutes with a REM latency of 71.5 minutes. The patient spent 2.4%% of the night in stage N1 sleep, 71.6%% in stage N2  sleep, 7.0%% in stage N3 and 19% in REM. Wake after sleep onset (WASO) was 6.5 minutes. The Arousal Index was 1.6/hour. ? ?LEG MOVEMENT DATA ?The total Periodic Limb Movements of Sleep (PLMS) were 0. The PLMS index was 0.0 . ? ?CARDIAC DATA ?The 2 lead EKG demonstrated sinus rhythm. The mean heart rate was 76.9 beats per minute. Other EKG findings include:  PVCs. ? ?IMPRESSIONS ?- Severe obstructive sleep apnea occurred during the diagnostic portion of the study (AHI = 93.1 /hour). ?- CPAP was not tolerated at effective pressureand titration was changed to BIPAP. An optimal PAP pressure was selected for this patient ( 16 / 12 cm of water) ?- Severe oxygen desaturation was noted during the diagnostic portion of the study (Min O2 = 52.0%). Mean O2 saturation on BIPAP 16/12 was 93.2%. ?- Snoring was audible during this study. ?- EKG findings include PVCs. ?- Clinically significant periodic limb movements of sleep did not occur during the study. ? ?DIAGNOSIS ?- Obstructive Sleep Apnea (G47.33) ? ?RECOMMENDATIONS ?- Trial of BiPAP therapy on 16/12 cm H2O with a Large size [employer]kel Full Face Simplus mask and heated humidification. ?- Be careful with alcohol, sedatives and other CNS depressants that may worsen sleep apnea and disrupt normal sleep architecture. ?- Sleep hygiene should be reviewed to assess factors that may improve sleep quality. ?- Weight management and regular exercise should be initiated or continued. . ? ?[Electronically signed] 09/05/2021 12:03 PM ? ?Baird Lyons MD, ABSM ?Diplomate, Tax adviser of Sleep Medicine ?NPI: [id number] ? ?  ? ? ? ? ? ? ? ? ? ? ? ? ? ? ? ? ? ? ? ? ? ?Joye Wesenberg ?Diplomate,  American Board of Sleep Medicine ? ?ELECTRONICALLY SIGNED ON:  09/05/2021, 11:52 AM ?[place] ?PH: [phone]   FX: [phone] ?ACCREDITED BY THE AMERICAN ACADEMY OF SLEEP MEDICINE ?

## 2021-09-10 DIAGNOSIS — D485 Neoplasm of uncertain behavior of skin: Secondary | ICD-10-CM | POA: Diagnosis not present

## 2021-09-10 DIAGNOSIS — D2361 Other benign neoplasm of skin of right upper limb, including shoulder: Secondary | ICD-10-CM | POA: Diagnosis not present

## 2021-09-24 DIAGNOSIS — Z125 Encounter for screening for malignant neoplasm of prostate: Secondary | ICD-10-CM | POA: Diagnosis not present

## 2021-09-24 DIAGNOSIS — Z0000 Encounter for general adult medical examination without abnormal findings: Secondary | ICD-10-CM | POA: Diagnosis not present

## 2021-09-24 DIAGNOSIS — Z23 Encounter for immunization: Secondary | ICD-10-CM | POA: Diagnosis not present

## 2021-09-24 DIAGNOSIS — Z1211 Encounter for screening for malignant neoplasm of colon: Secondary | ICD-10-CM | POA: Diagnosis not present

## 2021-10-02 ENCOUNTER — Ambulatory Visit (INDEPENDENT_AMBULATORY_CARE_PROVIDER_SITE_OTHER): Payer: BC Managed Care – PPO | Admitting: Adult Health

## 2021-10-02 ENCOUNTER — Encounter: Payer: Self-pay | Admitting: Adult Health

## 2021-10-02 DIAGNOSIS — G4733 Obstructive sleep apnea (adult) (pediatric): Secondary | ICD-10-CM

## 2021-10-02 NOTE — Assessment & Plan Note (Signed)
Healthy weight loss discussed 

## 2021-10-02 NOTE — Progress Notes (Signed)
@Patient  ID: Chad Kaiser, male    DOB: 04/24/1986, 36 y.o.   MRN: [id number]  Chief Complaint  Patient presents with   Consult    Referring provider: Mechele DawleyFernandez, Luisa, Cordelia Poche-C  HPI: 36 year old male seen for sleep consult Oct 02, 2021 for sleep apnea  TEST/EVENTS :  Split-night sleep study completed on August 20, 2021 showed severe sleep apnea with AHI 93.1/hour and SPO2 low at 52%, optimal control on BiPAP 16/ 12 cm H2O.  With large size fullface mask.  (CPAP was not tolerated and effective pressure)  10/02/2021 Sleep consult  Has an interpretor for visit today , speaks spanish and some english.  Patient presents today for a sleep consult.  Patient was kindly referred by his Adron BeneElaine Del Castillio Matos, PA .  Patient was complaining of snoring, restless sleep and daytime sleepiness.  Was set up for a split-night sleep study.  This was done on August 20, 2021.  This showed severe sleep apnea  with AHI at 93.1/hour and SPO2 low at 52%, optimal control on BiPAP 16/12 centimeters H2O.   We went over his sleep study in detail.  Patient education was given on sleep apnea and treatment options including weight loss and BiPAP usage and care.  Patient typically goes to bed about 10:30 at night.  Complains that he has snoring restless sleep and feels tired throughout the day.  Epworth score is 14 out of 24.  Typically gets sleepy if he is watching TV or in active. Typically gets up about 5:30 AM.  Weight is up about 40 pounds over the last 2 years.  Current weight is at 295 pounds with BMI of 43.  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 Minimal caffeine intake.  No symptoms suspicious for cataplexy or sleep paralysis.  Medical history significant for duodenal ulcer, history of GI bleed, hyperlipidemia, anxiety.  Surgical history previous knee surgery   Family history positive for asthma, heart disease and cancer  Social history patient is separated.  Has 1  child age 38.  Patient is from [place].  Speaks Spanish.  He does speak some [place] and understands English well.  He moved to [place] at age 36.  [place] always [place] in [place] to [place].  Rare smoking.  Occasional alcohol.  No drug use.  Lives with 2 friends currently.  Works in Publishing copydemolition type work     No Known Allergies  Immunization History  Administered Date(s) Administered   Pneumococcal Polysaccharide-23 12/25/2013   Td 09/24/2021    Past Medical History:  Diagnosis Date   Dieulafoy ulcer 2013    Tobacco History: Social History   Tobacco Use  Smoking Status Some Days   Types: Cigarettes  Smokeless Tobacco Not on file  Tobacco Comments   Smoked a year ago when he was having anxiety, this is no longer a problem.   Smokes socially when he has drink.  10/02/2021 hfb   Ready to quit: Not Answered Counseling given: Not Answered Tobacco comments: Smoked a year ago when he was having anxiety, this is no longer a problem. Smokes socially when he has drink.  10/02/2021 hfb   Outpatient Medications Prior to Visit  Medication Sig Dispense Refill   amoxicillin (AMOXIL) 500 MG capsule Take 1 capsule (500 mg total) by mouth 4 (four) times daily. (Patient not taking: Reported on 10/02/2021) 40 capsule 0   clarithromycin (BIAXIN) 500 MG tablet Take 1 tablet (500 mg  total) by mouth every 12 (twelve) hours. (Patient not taking: Reported on 10/02/2021) 20 tablet 0   pantoprazole (PROTONIX) 40 MG tablet Take 1 tablet (40 mg total) by mouth 2 (two) times daily. (Patient not taking: Reported on 10/02/2021) 90 tablet 3   No facility-administered medications prior to visit.     Review of Systems:   Constitutional:   No  weight loss, night sweats,  Fevers, chills,  +fatigue, or  lassitude.  HEENT:   No headaches,  Difficulty swallowing,  Tooth/dental problems, or  Sore throat,                No sneezing, itching, ear ache, nasal congestion, post nasal  drip,   CV:  No chest pain,  Orthopnea, PND, swelling in lower extremities, anasarca, dizziness, palpitations, syncope.   GI  No heartburn, indigestion, abdominal pain, nausea, vomiting, diarrhea, change in bowel habits, loss of appetite, bloody stools.   Resp: No shortness of breath with exertion or at rest.  No excess mucus, no productive cough,  No non-productive cough,  No coughing up of blood.  No change in color of mucus.  No wheezing.  No chest wall deformity  Skin: no rash or lesions.  GU: no dysuria, change in color of urine, no urgency or frequency.  No flank pain, no hematuria   MS:  No joint pain or swelling.  No decreased range of motion.  No back pain.    Physical Exam  BP 108/80 (BP Location: Left Arm, Patient Position: Sitting, Cuff Size: Large)   Pulse 98   Temp 98.8 F (37.1 C) (Oral)   Ht 5\' 10"  (1.778 m)   Wt 295 lb 6.4 oz (134 kg)   SpO2 96%   BMI 42.39 kg/m   GEN: A/Ox3; pleasant , NAD, well nourished    HEENT:  [place]/AT,   NOSE-clear, THROAT-clear, no lesions, no postnasal drip or exudate noted.  Class III MP airway.  Tonsils 2+, elongated uvula  NECK:  Supple w/ fair ROM; no JVD; normal carotid impulses w/o bruits; no thyromegaly or nodules palpated; no lymphadenopathy.    RESP  Clear  P & A; w/o, wheezes/ rales/ or rhonchi. no accessory muscle use, no dullness to percussion  CARD:  RRR, no m/r/g, no peripheral edema, pulses intact, no cyanosis or clubbing.  GI:   Soft & nt; nml bowel sounds; no organomegaly or masses detected.   Musco: Warm bil, no deformities or joint swelling noted.   Neuro: alert, no focal deficits noted.    Skin: Warm, no lesions or rashes    Lab Results:    BMET   ProBNP No results found for: PROBNP  Imaging: SLEEP STUDY DOCUMENTS  Result Date: 09/09/2021 Ordered by an unspecified provider.         View : No data to display.          No results found for: NITRICOXIDE      Assessment & Plan:    OSA (obstructive sleep apnea) Very severe sleep apnea with nocturnal hypoxemia.  Patient underwent a split-night sleep study that shows optimal control on BiPAP. We will begin BiPAP IPAP at 16 cm H2O.  EPAP at 12 cm H2O. Patient education given on sleep apnea and BiPAP care.  Patient was given written Spanish instructions.   - discussed how weight can impact sleep and risk for sleep disordered breathing - discussed options to assist with weight loss: combination of diet modification, cardiovascular and strength training exercises   -  had an extensive discussion regarding the adverse health consequences related to untreated sleep disordered breathing - specifically discussed the risks for hypertension, coronary artery disease, cardiac dysrhythmias, cerebrovascular disease, and diabetes - lifestyle modification discussed   - discussed how sleep disruption can increase risk of accidents, particularly when driving - safe driving practices were discussed   Plan  Patient Instructions  Begin BiPAP at bedtime .  You need to wear BiPAP all night long while you are sleeping.  If you take naps during the daytime you should wear your BiPAP Work on healthy weight loss Do not drive if sleepy Use sedating medications with caution.  Follow-up in 3 months and as needed   Comience BiPAP a la hora de acostarse. Debe usar BiPAP durante toda la noche mientras duerme. Si toma siestas durante el da, debe usar su BiPAP Trabajar en la prdida de peso saludable No conduzca si tiene sueo. Seguimiento en 3 meses y segn sea necesario     Morbid obesity (HCC) Healthy weight loss discussed     Rubye Oaks, NP 10/02/2021

## 2021-10-02 NOTE — Assessment & Plan Note (Addendum)
Very severe sleep apnea with nocturnal hypoxemia.  Patient underwent a split-night sleep study that shows optimal control on BiPAP. We will begin BiPAP IPAP at 16 cm H2O.  EPAP at 12 cm H2O. Patient education given on sleep apnea and BiPAP care.  Patient was given written Spanish instructions.   - discussed how weight can impact sleep and risk for sleep disordered breathing - discussed options to assist with weight loss: combination of diet modification, cardiovascular and strength training exercises   - had an extensive discussion regarding the adverse health consequences related to untreated sleep disordered breathing - specifically discussed the risks for hypertension, coronary artery disease, cardiac dysrhythmias, cerebrovascular disease, and diabetes - lifestyle modification discussed   - discussed how sleep disruption can increase risk of accidents, particularly when driving - safe driving practices were discussed   Plan  Patient Instructions  Begin BiPAP at bedtime .  You need to wear BiPAP all night long while you are sleeping.  If you take naps during the daytime you should wear your BiPAP Work on healthy weight loss Do not drive if sleepy Use sedating medications with caution.  Follow-up in 3 months and as needed   Comience BiPAP a la hora de acostarse. Debe usar BiPAP durante toda la noche mientras duerme. Si toma siestas durante el da, debe usar su BiPAP Trabajar en la prdida de peso saludable No conduzca si tiene sueo. Seguimiento en 3 meses y segn sea necesario

## 2021-10-02 NOTE — Patient Instructions (Addendum)
Begin BiPAP at bedtime .  You need to wear BiPAP all night long while you are sleeping.  If you take naps during the daytime you should wear your BiPAP Work on healthy weight loss Do not drive if sleepy Use sedating medications with caution.  Follow-up in 3 months and as needed   Comience BiPAP a la hora de acostarse. Debe usar BiPAP durante toda la noche mientras duerme. Si toma siestas durante el da, debe usar su BiPAP Trabajar en la prdida de peso saludable No conduzca si tiene sueo. Seguimiento en 3 meses y segn sea necesario

## 2021-10-09 DIAGNOSIS — R7401 Elevation of levels of liver transaminase levels: Secondary | ICD-10-CM | POA: Diagnosis not present

## 2021-10-09 DIAGNOSIS — R899 Unspecified abnormal finding in specimens from other organs, systems and tissues: Secondary | ICD-10-CM | POA: Diagnosis not present

## 2021-10-20 DIAGNOSIS — R160 Hepatomegaly, not elsewhere classified: Secondary | ICD-10-CM | POA: Diagnosis not present

## 2021-10-20 DIAGNOSIS — K760 Fatty (change of) liver, not elsewhere classified: Secondary | ICD-10-CM | POA: Diagnosis not present

## 2021-10-27 ENCOUNTER — Telehealth: Payer: Self-pay | Admitting: Adult Health

## 2021-10-27 DIAGNOSIS — G4733 Obstructive sleep apnea (adult) (pediatric): Secondary | ICD-10-CM

## 2021-10-28 NOTE — Telephone Encounter (Signed)
[place] wife checking on message sent for split night study results. [facility] phone number is [phone].

## 2021-10-29 NOTE — Telephone Encounter (Signed)
Left message for Chad Kaiser to call back.

## 2021-11-11 NOTE — Telephone Encounter (Signed)
Please advise on the results of recent sleep study.

## 2021-11-16 NOTE — Telephone Encounter (Signed)
I went over sleep study results at his office visit.  Did he get his BiPAP from last office visit. Please see my last office visit notes.  Please see if he has received his BIPAP ????

## 2021-11-16 NOTE — Telephone Encounter (Addendum)
Attempted to call Chad Kaiser but after the line rang twice, the line went to a busy tone. Will try to call back later.   Checked pt's chart to see if an order had been placed after OV with TP 5/19 and I do not see where an order had been placed. I have placed this order as urgent.  Routing to [employer] as an FYI with this info.

## 2021-11-27 NOTE — Telephone Encounter (Signed)
Can we follow back on this

## 2021-11-27 NOTE — Telephone Encounter (Signed)
Called [facility]- line rings then goes to busy tone. I sent community msg to [employer] to see if they can tell us if he was set up on BIPAP. Will await their response.

## 2021-11-30 NOTE — Telephone Encounter (Signed)
Response from community message [place] sent to [employer]:   New, Chad Kaiser, CMA; Madaline Brilliant,    Patient has not been setup at this time. We spoke to patient on 11/24/21 and notified him of a delay in his setup due to inventory shortage. At that time we explained we would call him as soon as the needed machine was available.   Thank you,   Luellen Pucker

## 2021-12-18 DIAGNOSIS — G4733 Obstructive sleep apnea (adult) (pediatric): Secondary | ICD-10-CM | POA: Diagnosis not present

## 2022-01-08 ENCOUNTER — Ambulatory Visit: Payer: BC Managed Care – PPO | Admitting: Adult Health

## 2022-01-15 ENCOUNTER — Encounter: Payer: Self-pay | Admitting: Adult Health

## 2022-01-15 ENCOUNTER — Ambulatory Visit: Payer: BC Managed Care – PPO | Admitting: Adult Health

## 2022-01-15 DIAGNOSIS — G4733 Obstructive sleep apnea (adult) (pediatric): Secondary | ICD-10-CM

## 2022-01-15 NOTE — Assessment & Plan Note (Signed)
Very severe sleep apnea.  Patient is off to a good start with his BiPAP.  He is feeling some benefit.  We will adjust BiPAP pressure for comfort. Changed to minimum EPAP 8 cm H2O.  Continue on IPAP max 16 cm H2O. We will get a download in 4 weeks.  Plan  Patient Instructions  Continue on BiPAP at bedtime .  You need to wear BiPAP all night long while you are sleeping.  If you take naps during the daytime you should wear your BiPAP.  It is important to wear your BiPAP for at least 6 or more hours each night Decrease BIPAP pressure .  Download in 4 weeks .  Work on healthy weight loss Do not drive if sleepy Use caution with sedating medications Follow-up in 4-6 months and as needed

## 2022-01-15 NOTE — Patient Instructions (Addendum)
Continue on BiPAP at bedtime .  You need to wear BiPAP all night long while you are sleeping.  If you take naps during the daytime you should wear your BiPAP.  It is important to wear your BiPAP for at least 6 or more hours each night Decrease BIPAP pressure .  Download in 4 weeks .  Work on healthy weight loss Do not drive if sleepy Use caution with sedating medications Follow-up in 4-6 months and as needed

## 2022-01-15 NOTE — Progress Notes (Signed)
@Patient  ID: Chad Kaiser, male    DOB: Jan 29, 1986, 36 y.o.   MRN: [id number]  Chief Complaint  Patient presents with   Follow-up    Referring provider: [id number]  HPI: 36 year old male seen for sleep consult Oct 02, 2021 to establish for sleep apnea Patient is Spanish-speaking  TEST/EVENTS :  Split-night sleep study completed on August 20, 2021 showed severe sleep apnea with AHI 93.1/hour and SPO2 low at 52%, optimal control on BiPAP 16/ 12 cm H2O.  With large size fullface mask.  (CPAP was not tolerated and effective pressure)   01/15/2022 Follow up : OSA  Today patient has an interpreter for today's visit. Presents for a 65-month follow-up.  Patient has underlying severe sleep apnea that was diagnosed earlier this year in April 2023.  Patient was having snoring and daytime sleepiness.  He was set up for a split-night sleep study that was done August 20, 2021 that showed severe sleep apnea with AHI at 93 point hour and SPO2 low at 52%.  He did require optimal control on BiPAP.  Patient was started on BiPAP last visit.  He is currently on BiPAP with IPAP  at 16 cm H2O.  And EPAP at 12 cm H2O.  Since last visit patient says he is trying to get used to BiPAP.  He is trying to wear it each night.  He is getting in about 5 hours of sleep on BiPAP.  Says it is making some difference with decreased daytime sleepiness.  Feels he is benefiting from BiPAP BiPAP download shows 67% compliance with daily average usage at 5 hours.  AHI is 2/hour. Patient is using nasal mask.  Does feel like the BiPAP pressure is too high at times.  No Known Allergies  Immunization History  Administered Date(s) Administered   Pneumococcal Polysaccharide-23 12/25/2013   Td 09/24/2021    Past Medical History:  Diagnosis Date   Dieulafoy ulcer 2013    Tobacco History: Social History   Tobacco Use  Smoking Status Some Days   Types: Cigarettes  Smokeless Tobacco Not on file  Tobacco Comments    Smoked a year ago when he was having anxiety, this is no longer a problem.   Smokes socially when he has drink.  01/15/2022 hfb   Ready to quit: No Counseling given: Yes Tobacco comments: Smoked a year ago when he was having anxiety, this is no longer a problem. Smokes socially when he has drink.  01/15/2022 hfb   Outpatient Medications Prior to Visit  Medication Sig Dispense Refill   finasteride (PROSCAR) 5 MG tablet 1 tablet Orally Once a day for 90 days     No facility-administered medications prior to visit.     Review of Systems:   Constitutional:   No  weight loss, night sweats,  Fevers, chills, fatigue, or  lassitude.  HEENT:   No headaches,  Difficulty swallowing,  Tooth/dental problems, or  Sore throat,                No sneezing, itching, ear ache, nasal congestion, post nasal drip,   CV:  No chest pain,  Orthopnea, PND, swelling in lower extremities, anasarca, dizziness, palpitations, syncope.   GI  No heartburn, indigestion, abdominal pain, nausea, vomiting, diarrhea, change in bowel habits, loss of appetite, bloody stools.   Resp: No shortness of breath with exertion or at rest.  No excess mucus, no productive cough,  No non-productive cough,  No coughing up of blood.  No  change in color of mucus.  No wheezing.  No chest wall deformity  Skin: no rash or lesions.  GU: no dysuria, change in color of urine, no urgency or frequency.  No flank pain, no hematuria   MS:  No joint pain or swelling.  No decreased range of motion.  No back pain.    Physical Exam  BP 104/70 (BP Location: Left Arm, Patient Position: Sitting, Cuff Size: Large)   Pulse 85   Temp 97.6 F (36.4 C) (Oral)   Ht 5\' 11"  (1.803 m)   Wt 291 lb 9.6 oz (132.3 kg)   SpO2 97%   BMI 40.67 kg/m   GEN: A/Ox3; pleasant , NAD, well nourished    HEENT:  [place]/AT, NOSE-clear, THROAT-clear, no lesions, no postnasal drip or exudate noted.  Class II-III MP airway, tonsils 2+  NECK:  Supple w/ fair ROM; no  JVD; normal carotid impulses w/o bruits; no thyromegaly or nodules palpated; no lymphadenopathy.    RESP  Clear  P & A; w/o, wheezes/ rales/ or rhonchi. no accessory muscle use, no dullness to percussion  CARD:  RRR, no m/r/g, no peripheral edema, pulses intact, no cyanosis or clubbing.  GI:   Soft & nt; nml bowel sounds; no organomegaly or masses detected.   Musco: Warm bil, no deformities or joint swelling noted.   Neuro: alert, no focal deficits noted.    Skin: Warm, no lesions or rashes    Lab Results:   BNP No results found for: "BNP"  ProBNP No results found for: "PROBNP"  Imaging: No results found.        No data to display          No results found for: "NITRICOXIDE"      Assessment & Plan:   OSA (obstructive sleep apnea) Very severe sleep apnea.  Patient is off to a good start with his BiPAP.  He is feeling some benefit.  We will adjust BiPAP pressure for comfort. Changed to minimum EPAP 8 cm H2O.  Continue on IPAP max 16 cm H2O. We will get a download in 4 weeks.  Plan  Patient Instructions  Continue on BiPAP at bedtime .  You need to wear BiPAP all night long while you are sleeping.  If you take naps during the daytime you should wear your BiPAP.  It is important to wear your BiPAP for at least 6 or more hours each night Decrease BIPAP pressure .  Download in 4 weeks .  Work on healthy weight loss Do not drive if sleepy Use caution with sedating medications Follow-up in 4-6 months and as needed     Morbid obesity (HCC) Healthy weight loss discussed    , NP 01/15/2022

## 2022-01-15 NOTE — Addendum Note (Signed)
Addended by: Delrae Rend on: 01/15/2022 01:59 PM   Modules accepted: Orders

## 2022-01-15 NOTE — Assessment & Plan Note (Signed)
Healthy weight loss discussed 

## 2022-01-18 DIAGNOSIS — G4733 Obstructive sleep apnea (adult) (pediatric): Secondary | ICD-10-CM | POA: Diagnosis not present

## 2022-01-19 NOTE — Progress Notes (Signed)
Reviewed and agree with assessment/plan.   Ladeana Laplant, MD [facility] 01/19/2022, 8:09 AM Pager:  [phone]  

## 2022-02-17 DIAGNOSIS — G4733 Obstructive sleep apnea (adult) (pediatric): Secondary | ICD-10-CM | POA: Diagnosis not present

## 2022-03-20 DIAGNOSIS — G4733 Obstructive sleep apnea (adult) (pediatric): Secondary | ICD-10-CM | POA: Diagnosis not present

## 2022-03-25 DIAGNOSIS — G4733 Obstructive sleep apnea (adult) (pediatric): Secondary | ICD-10-CM | POA: Diagnosis not present

## 2022-04-19 DIAGNOSIS — G4733 Obstructive sleep apnea (adult) (pediatric): Secondary | ICD-10-CM | POA: Diagnosis not present

## 2022-04-24 DIAGNOSIS — G4733 Obstructive sleep apnea (adult) (pediatric): Secondary | ICD-10-CM | POA: Diagnosis not present

## 2022-05-20 DIAGNOSIS — G4733 Obstructive sleep apnea (adult) (pediatric): Secondary | ICD-10-CM | POA: Diagnosis not present

## 2022-05-25 DIAGNOSIS — G4733 Obstructive sleep apnea (adult) (pediatric): Secondary | ICD-10-CM | POA: Diagnosis not present

## 2022-06-17 ENCOUNTER — Ambulatory Visit: Payer: BC Managed Care – PPO | Admitting: Adult Health

## 2022-06-20 DIAGNOSIS — G4733 Obstructive sleep apnea (adult) (pediatric): Secondary | ICD-10-CM | POA: Diagnosis not present

## 2022-06-25 DIAGNOSIS — G4733 Obstructive sleep apnea (adult) (pediatric): Secondary | ICD-10-CM | POA: Diagnosis not present

## 2022-07-19 DIAGNOSIS — G4733 Obstructive sleep apnea (adult) (pediatric): Secondary | ICD-10-CM | POA: Diagnosis not present

## 2022-07-24 DIAGNOSIS — G4733 Obstructive sleep apnea (adult) (pediatric): Secondary | ICD-10-CM | POA: Diagnosis not present

## 2022-08-19 DIAGNOSIS — G4733 Obstructive sleep apnea (adult) (pediatric): Secondary | ICD-10-CM | POA: Diagnosis not present

## 2022-08-24 DIAGNOSIS — G4733 Obstructive sleep apnea (adult) (pediatric): Secondary | ICD-10-CM | POA: Diagnosis not present

## 2022-09-18 DIAGNOSIS — G4733 Obstructive sleep apnea (adult) (pediatric): Secondary | ICD-10-CM | POA: Diagnosis not present

## 2022-10-19 DIAGNOSIS — G4733 Obstructive sleep apnea (adult) (pediatric): Secondary | ICD-10-CM | POA: Diagnosis not present

## 2022-12-08 DIAGNOSIS — G4733 Obstructive sleep apnea (adult) (pediatric): Secondary | ICD-10-CM | POA: Diagnosis not present

## 2023-01-08 DIAGNOSIS — G4733 Obstructive sleep apnea (adult) (pediatric): Secondary | ICD-10-CM | POA: Diagnosis not present

## 2023-02-08 DIAGNOSIS — G4733 Obstructive sleep apnea (adult) (pediatric): Secondary | ICD-10-CM | POA: Diagnosis not present

## 2023-04-13 DIAGNOSIS — G4733 Obstructive sleep apnea (adult) (pediatric): Secondary | ICD-10-CM | POA: Diagnosis not present

## 2023-05-13 DIAGNOSIS — G4733 Obstructive sleep apnea (adult) (pediatric): Secondary | ICD-10-CM | POA: Diagnosis not present

## 2023-06-13 DIAGNOSIS — G4733 Obstructive sleep apnea (adult) (pediatric): Secondary | ICD-10-CM | POA: Diagnosis not present

## 2023-07-15 DIAGNOSIS — G4733 Obstructive sleep apnea (adult) (pediatric): Secondary | ICD-10-CM | POA: Diagnosis not present

## 2023-10-27 DIAGNOSIS — G4733 Obstructive sleep apnea (adult) (pediatric): Secondary | ICD-10-CM | POA: Diagnosis not present

## 2023-11-26 DIAGNOSIS — G4733 Obstructive sleep apnea (adult) (pediatric): Secondary | ICD-10-CM | POA: Diagnosis not present

## 2023-12-27 DIAGNOSIS — G4733 Obstructive sleep apnea (adult) (pediatric): Secondary | ICD-10-CM | POA: Diagnosis not present

## 2024-01-30 DIAGNOSIS — G4733 Obstructive sleep apnea (adult) (pediatric): Secondary | ICD-10-CM | POA: Diagnosis not present

## 2024-02-29 DIAGNOSIS — G4733 Obstructive sleep apnea (adult) (pediatric): Secondary | ICD-10-CM | POA: Diagnosis not present
